# Patient Record
Sex: Female | Born: 1965 | Race: Black or African American | Hispanic: No | Marital: Single | Smoking: Current every day smoker
Health system: Southern US, Community
[De-identification: ages and names within clinical notes are randomized; demographics above are authoritative.]

## PROBLEM LIST (undated history)

## (undated) DIAGNOSIS — D649 Anemia, unspecified: Secondary | ICD-10-CM

## (undated) DIAGNOSIS — C801 Malignant (primary) neoplasm, unspecified: Secondary | ICD-10-CM

## (undated) HISTORY — DX: Anemia, unspecified: D64.9

---
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
## 2010-04-01 ENCOUNTER — Emergency Department (HOSPITAL_COMMUNITY): Admission: EM | Admit: 2010-04-01 | Discharge: 2010-04-01 | Payer: Self-pay | Admitting: Emergency Medicine

## 2010-08-22 LAB — BASIC METABOLIC PANEL
Calcium: 9.6 mg/dL (ref 8.4–10.5)
GFR calc non Af Amer: >60 mL/min (ref >60)
Glucose, Bld: 104 mg/dL — ABNORMAL HIGH (ref 70–99)
Sodium: 138 mEq/L (ref 135–145)

## 2010-08-22 LAB — DIFFERENTIAL
Basophils Absolute: 0 10*3/uL (ref 0.0–0.1)
Basophils Relative: 0 % (ref 0–1)
Neutro Abs: 5.9 10*3/uL (ref 1.7–7.7)
Neutrophils Relative %: 66 % (ref 43–77)

## 2010-08-22 LAB — CBC
Hemoglobin: 13 g/dL (ref 12.0–15.0)
MCHC: 33.3 g/dL (ref 30.0–36.0)
RDW: 15.9 % — ABNORMAL HIGH (ref 11.5–15.5)
WBC: 8.9 10*3/uL (ref 4.0–10.5)

## 2016-10-22 ENCOUNTER — Other Ambulatory Visit (HOSPITAL_COMMUNITY): Payer: Self-pay | Admitting: *Deleted

## 2016-10-22 DIAGNOSIS — R2232 Localized swelling, mass and lump, left upper limb: Secondary | ICD-10-CM

## 2016-10-22 DIAGNOSIS — N6452 Nipple discharge: Secondary | ICD-10-CM

## 2016-10-24 ENCOUNTER — Ambulatory Visit (HOSPITAL_COMMUNITY)
Admission: RE | Admit: 2016-10-24 | Discharge: 2016-10-24 | Disposition: A | Discharge status: Home / Self Care | Payer: Self-pay | Source: Ambulatory Visit | Attending: Obstetrics and Gynecology | Admitting: Obstetrics and Gynecology

## 2016-10-24 ENCOUNTER — Encounter (HOSPITAL_COMMUNITY): Payer: Self-pay | Admitting: *Deleted

## 2016-10-24 ENCOUNTER — Other Ambulatory Visit (HOSPITAL_COMMUNITY): Payer: Self-pay | Admitting: Obstetrics and Gynecology

## 2016-10-24 ENCOUNTER — Ambulatory Visit
Admission: RE | Admit: 2016-10-24 | Discharge: 2016-10-24 | Disposition: A | Discharge status: Home / Self Care | Payer: No Typology Code available for payment source | Source: Ambulatory Visit | Attending: Obstetrics and Gynecology | Admitting: Obstetrics and Gynecology

## 2016-10-24 VITALS — BP 114/72 | Temp 98.2°F | Ht 70.0 in | Wt 166.8 lb

## 2016-10-24 DIAGNOSIS — N6320 Unspecified lump in the left breast, unspecified quadrant: Secondary | ICD-10-CM

## 2016-10-24 DIAGNOSIS — Z01419 Encounter for gynecological examination (general) (routine) without abnormal findings: Secondary | ICD-10-CM

## 2016-10-24 DIAGNOSIS — N6452 Nipple discharge: Secondary | ICD-10-CM

## 2016-10-24 DIAGNOSIS — R2232 Localized swelling, mass and lump, left upper limb: Secondary | ICD-10-CM

## 2016-10-24 DIAGNOSIS — N6321 Unspecified lump in the left breast, upper outer quadrant: Secondary | ICD-10-CM

## 2016-10-24 NOTE — Progress Notes (Signed)
Complaints of left axillary lump since December 2017 that has increase in size. Patient complained bloody spontaneous discharge from left breast.  Pap Smear: Pap smear completed today. Last Pap smear was 29 years ago and normal per patient. Per patient has no history of an abnormal Pap smear. No Pap smear results are in EPIC.  Physical exam: Breasts Breasts symmetrical. No skin abnormalities bilateral breasts. No nipple retraction bilateral breasts. No nipple discharge right breast. Bloody discharge expressed from left breast. Sample of discharge sent to cytology for evaluation. No lymphadenopathy. No lumps palpated right breast. Palpated a lump lump within the left breast at 2 o'clock 5 cm from the nipple and in the left axilla at 2 o'clock 21 cm from the nipple. No complaints of pain or tenderness on exam. Referred patient to the [facility] for a diagnostic mammogram and left breast ultrasound. Appointment scheduled for Thursday, Oct 24, 2016 at 1500.  Pelvic/Bimanual   Ext Genitalia No lesions, no swelling and no discharge observed on external genitalia.         Vagina Vagina pink and normal texture. No lesions or discharge observed in vagina.          Cervix Cervix is present. Cervix pink and of normal texture. No discharge observed.     Uterus Uterus is present and palpable. Uterus is tilted to the left and normal size.        Adnexae Bilateral ovaries present and palpable. No tenderness on palpation.          Rectovaginal No rectal exam completed today since patient had no rectal complaints. No skin abnormalities observed on exam.    Smoking History: Patient is a current smoker. Discussed smoking cessation with patient. Referred to the [facility] Quitline and gave resources to free smoking cessation classes at [facility].  Patient Navigation: Patient education provided. Access to services provided for patient through [facility] program.   Colorectal Cancer Screening: Per  patient has never had a colonoscopy completed. No complaints today. FIT Test given to patient to complete and return to [facility].

## 2016-10-24 NOTE — Patient Instructions (Addendum)
Explained breast self awareness with Levy Pupa. Let patient know [facility] will cover Pap smears and HPV typing every 5 years unless has a history of abnormal Pap smears. Referred patient to the [facility] for a diagnostic mammogram and left breast ultrasound. Appointment scheduled for Thursday, Oct 24, 2016 at 1500. Let patient know will follow up with her within the next couple weeks with results of Pap smear by phone. Discussed smoking cessation with patient. Referred to the [facility] Quitline and gave resources to free smoking cessation classes at [facility]. Levy Pupa verbalized understanding.  Brannock, Arvil Chaco, RN 3:23 PM

## 2016-10-25 ENCOUNTER — Encounter (HOSPITAL_COMMUNITY): Payer: Self-pay | Admitting: *Deleted

## 2016-10-25 LAB — CYTOLOGY - PAP
DIAGNOSIS: NEGATIVE
HPV (WINDOPATH): NOT DETECTED

## 2016-10-30 ENCOUNTER — Ambulatory Visit
Admission: RE | Admit: 2016-10-30 | Discharge: 2016-10-30 | Disposition: A | Discharge status: Home / Self Care | Payer: No Typology Code available for payment source | Source: Ambulatory Visit | Attending: Obstetrics and Gynecology | Admitting: Obstetrics and Gynecology

## 2016-10-30 ENCOUNTER — Other Ambulatory Visit (HOSPITAL_COMMUNITY): Payer: Self-pay | Admitting: Obstetrics and Gynecology

## 2016-10-30 DIAGNOSIS — N6320 Unspecified lump in the left breast, unspecified quadrant: Secondary | ICD-10-CM

## 2016-10-30 DIAGNOSIS — N6452 Nipple discharge: Secondary | ICD-10-CM

## 2016-11-06 ENCOUNTER — Telehealth (HOSPITAL_COMMUNITY): Payer: Self-pay | Admitting: *Deleted

## 2016-11-06 NOTE — Telephone Encounter (Signed)
Attempted to call patient to discuss breast discharge and Pap smear results. No one answered phone. Left voicemail for patient to call me back.   Patient called me back. Asked patient if the bloody breast discharge has resolved. Patient stated it has. Let her know if it starts again to call me. Told her I would refer her for a surgical consult. Informed patient that [facility] would cover. Also, let patient know that her Pap smear is normal and HPV negative. Told her that she her next Pap smear is due in 5 years since she has no history of an abnormal Pap smear. Patient verbalized understanding.

## 2017-02-03 ENCOUNTER — Telehealth (HOSPITAL_COMMUNITY): Payer: Self-pay

## 2017-02-03 NOTE — Telephone Encounter (Signed)
Left message with patient her about the at home FIT test that was given to her in [place] on 10/24/16. I let the patient know if she had any questions she could call me back.

## 2018-03-26 ENCOUNTER — Encounter (HOSPITAL_COMMUNITY): Payer: Self-pay | Admitting: Emergency Medicine

## 2018-03-26 ENCOUNTER — Emergency Department (HOSPITAL_COMMUNITY): Payer: BC Managed Care – PPO

## 2018-03-26 ENCOUNTER — Emergency Department (HOSPITAL_COMMUNITY)
Admission: EM | Admit: 2018-03-26 | Discharge: 2018-03-26 | Disposition: A | Discharge status: Home / Self Care | Payer: BC Managed Care – PPO | Attending: Emergency Medicine | Admitting: Emergency Medicine

## 2018-03-26 DIAGNOSIS — E876 Hypokalemia: Secondary | ICD-10-CM

## 2018-03-26 DIAGNOSIS — F17210 Nicotine dependence, cigarettes, uncomplicated: Secondary | ICD-10-CM | POA: Insufficient documentation

## 2018-03-26 DIAGNOSIS — R1031 Right lower quadrant pain: Secondary | ICD-10-CM | POA: Insufficient documentation

## 2018-03-26 LAB — COMPREHENSIVE METABOLIC PANEL
ALBUMIN: 3.8 g/dL (ref 3.5–5.0)
ALT: 28 U/L (ref 0–44)
AST: 34 U/L (ref 15–41)
Alkaline Phosphatase: 68 U/L (ref 38–126)
Anion gap: 9 (ref 5–15)
BUN: 5 mg/dL — AB (ref 6–20)
CHLORIDE: 102 mmol/L (ref 98–111)
CO2: 25 mmol/L (ref 22–32)
CREATININE: 0.7 mg/dL (ref 0.44–1.00)
Calcium: 9.4 mg/dL (ref 8.9–10.3)
GFR calc Af Amer: >60 mL/min (ref >60)
GFR calc non Af Amer: >60 mL/min (ref >60)
GLUCOSE: 111 mg/dL — AB (ref 70–99)
Potassium: 3.3 mmol/L — ABNORMAL LOW (ref 3.5–5.1)
SODIUM: 136 mmol/L (ref 135–145)
Total Bilirubin: 0.6 mg/dL (ref 0.3–1.2)
Total Protein: 7.6 g/dL (ref 6.5–8.1)

## 2018-03-26 LAB — URINALYSIS, ROUTINE W REFLEX MICROSCOPIC
Bilirubin Urine: NEGATIVE
Glucose, UA: 50 mg/dL — AB
KETONES UR: 20 mg/dL — AB
Leukocytes, UA: NEGATIVE
Nitrite: POSITIVE — AB
PROTEIN: NEGATIVE mg/dL
Specific Gravity, Urine: 1.02 (ref 1.005–1.030)
pH: 6 (ref 5.0–8.0)

## 2018-03-26 LAB — CBC
HEMATOCRIT: 34.8 % — AB (ref 36.0–46.0)
Hemoglobin: 10.8 g/dL — ABNORMAL LOW (ref 12.0–15.0)
MCH: 23.7 pg — ABNORMAL LOW (ref 26.0–34.0)
MCHC: 31 g/dL (ref 30.0–36.0)
MCV: 76.5 fL — AB (ref 80.0–100.0)
Platelets: 573 10*3/uL — ABNORMAL HIGH (ref 150–400)
RBC: 4.55 MIL/uL (ref 3.87–5.11)
RDW: 19.2 % — AB (ref 11.5–15.5)
WBC: 7.5 10*3/uL (ref 4.0–10.5)
nRBC: 0 % (ref 0.0–0.2)

## 2018-03-26 LAB — LIPASE, BLOOD: LIPASE: 30 U/L (ref 11–51)

## 2018-03-26 MED ORDER — METRONIDAZOLE 500 MG PO TABS
500.0000 mg | ORAL_TABLET | Freq: Once | ORAL | Status: AC
  Administered 2018-03-26: 500 mg via ORAL
  Filled 2018-03-26: qty 1

## 2018-03-26 MED ORDER — ONDANSETRON HCL 4 MG/2ML IJ SOLN
4.0000 mg | Freq: Once | INTRAMUSCULAR | Status: AC
  Administered 2018-03-26: 4 mg via INTRAVENOUS
  Filled 2018-03-26: qty 2

## 2018-03-26 MED ORDER — POTASSIUM CHLORIDE CRYS ER 20 MEQ PO TBCR: 20.0000 meq | EXTENDED_RELEASE_TABLET | Freq: Two times a day (BID) | ORAL | 0 refills | Status: DC

## 2018-03-26 MED ORDER — CIPROFLOXACIN HCL 500 MG PO TABS: 500.0000 mg | ORAL_TABLET | Freq: Two times a day (BID) | ORAL | 0 refills | Status: DC

## 2018-03-26 MED ORDER — IOHEXOL 300 MG/ML  SOLN
100.0000 mL | Freq: Once | INTRAMUSCULAR | Status: AC | PRN
  Administered 2018-03-26: 100 mL via INTRAVENOUS

## 2018-03-26 MED ORDER — CIPROFLOXACIN HCL 500 MG PO TABS
500.0000 mg | ORAL_TABLET | Freq: Once | ORAL | Status: AC
  Administered 2018-03-26: 500 mg via ORAL
  Filled 2018-03-26: qty 1

## 2018-03-26 MED ORDER — SODIUM CHLORIDE 0.9 % IV BOLUS
1000.0000 mL | Freq: Once | INTRAVENOUS | Status: AC
  Administered 2018-03-26: 1000 mL via INTRAVENOUS

## 2018-03-26 MED ORDER — METRONIDAZOLE 500 MG PO TABS: 500.0000 mg | ORAL_TABLET | Freq: Three times a day (TID) | ORAL | 0 refills | Status: DC

## 2018-03-26 MED ORDER — MORPHINE SULFATE (PF) 4 MG/ML IV SOLN
4.0000 mg | Freq: Once | INTRAVENOUS | Status: AC
  Administered 2018-03-26: 4 mg via INTRAVENOUS
  Filled 2018-03-26: qty 1

## 2018-03-26 MED ORDER — ONDANSETRON 4 MG PO TBDP: 4.0000 mg | ORAL_TABLET | Freq: Three times a day (TID) | ORAL | 0 refills | Status: DC | PRN

## 2018-03-26 NOTE — Discharge Instructions (Addendum)
Thank you for allowing me to care for you today in the Emergency Department.   You have been seen in the Emergency Department (ED) for abdominal pain.  You had a CT scan that showed possible infection in the colon but also is concerning for a mass or tumor.  You have been scheduled for an appointment with [facility] gastroenterology (stomach doctor) tomorrow to be checked for this.  Your potassium was slightly low today, likely from your recent vomiting.  Take 1 tablet of potassium chloride 2 times daily for the next 5 days.  If you continue to have nausea or vomiting, let 1 tablet of Zofran dissolve in your tongue every 8 hours as needed.  For pain control, you can take 600 mg of ibuprofen with food or 650 mg of Tylenol every 6 hours.  Continue to hydrate with water or electrolyte drinks until your symptoms resolved.  Please follow up as instructed above regarding todays emergent visit and the symptoms that are bothering you.  Return to the ED if your abdominal pain worsens or fails to improve, you develop bloody vomiting, bloody diarrhea, you are unable to tolerate fluids due to vomiting, fever greater than 101, or other symptoms that concern you.

## 2018-03-26 NOTE — Discharge Planning (Signed)
Cyrene Gharibian J. Clydene Laming, RN, BSN, [place]  [facility] set up appointment with Domenica Fail, PA-C at [facility] on 11/11 @ 2:30.  Spoke with pt at bedside and advised to please arrive 15 min early and take a picture ID and your current medications.  Pt verbalizes understanding of keeping appointment.

## 2018-03-26 NOTE — ED Provider Notes (Addendum)
8:32 AM Handoff from [place] PA-C at shift change. Pt with 3 days of diarrhea and vomiting. She has RLQ pain, pending CT. Also reports recent weight loss. No prior colonoscopy.   CT with features concerning of possible mass in the cecal area.  Also possible colitis.  Patient will be treated for colitis with antibiotics given her vomiting and diarrhea.  I will speak with GI to help set up colonoscopy.  Case manager to see regarding primary care follow-up.  Patient updated and is in agreement with plan.  9:26 AM GI appointment tomorrow at 9:30am.   PCP appointment scheduled for 04/20/2018.  Patient has received first dose of antibiotics.  She is aware of plan and in agreement.  Home with symptom medic treatment as well.  The patient was urged to return to the Emergency Department immediately with worsening of current symptoms, worsening abdominal pain, persistent vomiting, blood noted in stools, fever, or any other concerns. The patient verbalized understanding.     Carlisle Cater, PA-C 03/26/18 2841    [place] Cater, PA-C 03/26/18 3244    [facility] Rasmussen, MD 03/27/18 1225

## 2018-03-26 NOTE — ED Triage Notes (Signed)
Patient reports mid abdominal pain with emesis and diarrhea onset 3 days ago , denies fever or chills .

## 2018-03-26 NOTE — ED Provider Notes (Signed)
[facility] EMERGENCY DEPARTMENT Provider Note   CSN: [id number] Arrival date & time: 03/26/18  0326     History   Chief Complaint Chief Complaint  Patient presents with  . Abdominal Pain    HPI Tara Baxter is a 52 y.o. female no pertinent past medical history who presents to the emergency department with a chief complaint of nominal pain, nausea, vomiting, and diarrhea, onset 3 days ago.  She endorses sudden onset, constant, non-radiating RLQ with multiple episodes of non-bloody emesis and diarrhea.  Reports decreased appetite since onset of symptoms.  She denies a fever, chills, dysuria, urinary frequency or hesitancy, melena, hematochezia, vaginal bleeding or discharge, back pain, lightheadedness, chest pain, or dyspnea.  No history of similar.  She has been treating her symptoms by drinking Gatorade.  No known sick contacts.  No concern for questionable food intake. No recent travel.  No history of abdominal surgery.  The history is provided by the patient. No language interpreter was used.    History reviewed. No pertinent past medical history.  There are no active problems to display for this patient.   History reviewed. No pertinent surgical history.   OB History    Gravida  2   Para      Term      Preterm      AB      Living  2     SAB      TAB      Ectopic      Multiple      Live Births  2            Home Medications    Prior to Admission medications   Medication Sig Start Date End Date Taking? Authorizing Provider  ondansetron (ZOFRAN ODT) 4 MG disintegrating tablet Take 1 tablet (4 mg total) by mouth every 8 (eight) hours as needed for nausea or vomiting. 03/26/18   McDonald, Mia A, PA-C  potassium chloride SA (K-DUR,KLOR-CON) 20 MEQ tablet Take 1 tablet (20 mEq total) by mouth 2 (two) times daily for 5 days. 03/26/18 03/31/18  McDonald, Laymond Purser, PA-C    Family History Family History  Problem Relation Age of Onset    . Diabetes Mother   . Kidney disease Mother   . Hypertension Mother   . Hypertension Sister   . Hypertension Brother   . Breast cancer Maternal Aunt     Social History Social History   Tobacco Use  . Smoking status: Current Every Day Smoker    Packs/day: 0.25    Years: 25.00    Pack years: 6.25    Types: Cigarettes  . Smokeless tobacco: Never Used  Substance Use Topics  . Alcohol use: No  . Drug use: No     Allergies   Patient has no known allergies.   Review of Systems Review of Systems  Constitutional: Negative for activity change, chills and fever.  HENT: Negative for congestion.   Respiratory: Negative for cough and shortness of breath.   Cardiovascular: Negative for chest pain.  Gastrointestinal: Positive for abdominal pain, diarrhea, nausea and vomiting. Negative for anal bleeding, blood in stool, constipation and rectal pain.  Genitourinary: Negative for dysuria, frequency, urgency, vaginal bleeding and vaginal discharge.  Musculoskeletal: Negative for back pain.  Skin: Negative for rash.  Allergic/Immunologic: Negative for immunocompromised state.  Neurological: Negative for headaches.  Psychiatric/Behavioral: Negative for confusion.     Physical Exam Updated Vital Signs BP 140/77   Pulse Marland Kitchen)  52   Temp 98.8 F (37.1 C) (Oral)   Resp 16   SpO2 98%   Physical Exam  Constitutional: No distress.  HENT:  Head: Normocephalic.  Eyes: Conjunctivae are normal.  Neck: Neck supple.  Cardiovascular: Normal rate, regular rhythm, normal heart sounds and intact distal pulses. Exam reveals no gallop and no friction rub.  No murmur heard. Pulmonary/Chest: Effort normal and breath sounds normal. No stridor. No respiratory distress. She has no wheezes. She has no rales. She exhibits no tenderness.  Abdominal: Soft. Bowel sounds are normal. She exhibits no distension and no mass. There is tenderness. There is no rebound and no guarding. No hernia.  Focal TTP in  the RLQ over McBurney's point. No rebound or guarding. Negative Rovsing's. Negative CVA tenderness bilaterally. Negative Murphy's sign.    Musculoskeletal: She exhibits no tenderness.  Neurological: She is alert.  Skin: Skin is warm. Capillary refill takes 2 to 3 seconds. No rash noted. She is not diaphoretic.  Psychiatric: Her behavior is normal.  Nursing note and vitals reviewed.  ED Treatments / Results  Labs (all labs ordered are listed, but only abnormal results are displayed) Labs Reviewed  COMPREHENSIVE METABOLIC PANEL - Abnormal; Notable for the following components:      Result Value   Potassium 3.3 (*)    Glucose, Bld 111 (*)    BUN 5 (*)    All other components within normal limits  CBC - Abnormal; Notable for the following components:   Hemoglobin 10.8 (*)    HCT 34.8 (*)    MCV 76.5 (*)    MCH 23.7 (*)    RDW 19.2 (*)    Platelets 573 (*)    All other components within normal limits  LIPASE, BLOOD  URINALYSIS, ROUTINE W REFLEX MICROSCOPIC    EKG None  Radiology No results found.  Procedures Procedures (including critical care time)  Medications Ordered in ED Medications  ondansetron (ZOFRAN) injection 4 mg (4 mg Intravenous Given 03/26/18 0435)  morphine 4 MG/ML injection 4 mg (4 mg Intravenous Given 03/26/18 0436)  sodium chloride 0.9 % bolus 1,000 mL (0 mLs Intravenous Stopped 03/26/18 0536)     Initial Impression / Assessment and Plan / ED Course  I have reviewed the triage vital signs and the nursing notes.  Pertinent labs & imaging results that were available during my care of the patient were reviewed by me and considered in my medical decision making (see chart for details).     52 year old female with no pertinent past medical history presenting with nausea, vomiting, diarrhea, and abdominal pain x3 days.  On exam, she has point tenderness in her right lower quadrant over McBurney's point.  The remainder of her abdominal exam is unremarkable  and without tenderness.  Afebrile without tachycardia and normotensive in the ED.  Zofran given for nausea and vomiting.  She was given IV fluids and morphine for pain control.  On reevaluation, no additional episodes of nausea or vomiting, and she reports moderate improvement in her pain.  On reexamination, she continues to have focal tenderness in her right lower quadrant.  Doubt cholecystitis, diverticulitis, pyelonephritis, ovarian torsion, or mesenteric ischemia.  Labs are notable for hypokalemia of 3.3 and hemoglobin of 10.8, which is approximately 3 mg/dl decreased since her last blood work in 2011.  Suspect this may be chronic as the patient has no signs or symptoms of active bleeding at this time.  Given continued pain, will order CT abdomen pelvis to assess  for atypical presenting appendicitis.  If CT is unremarkable for acute pathology, will plan to discharge the patient home with Zofran and potassium chloride as well as follow-up with primary care for her hemoglobin level if she is successfully fluid challenged.    Patient care transferred to PA Geiple at the end of my shift. Patient presentation, ED course, and plan of care discussed with review of all pertinent labs and imaging. Please see his/her note for further details regarding further ED course and disposition.  Final Clinical Impressions(s) / ED Diagnoses   Final diagnoses:  None    ED Discharge Orders         Ordered    potassium chloride SA (K-DUR,KLOR-CON) 20 MEQ tablet  2 times daily     03/26/18 0651    ondansetron (ZOFRAN ODT) 4 MG disintegrating tablet  Every 8 hours PRN     03/26/18 0651           Joanne Gavel, PA-C 03/26/18 5573    Palumbo, April, MD 03/26/18 2202

## 2018-03-27 ENCOUNTER — Encounter: Payer: Self-pay | Admitting: Gastroenterology

## 2018-03-27 ENCOUNTER — Ambulatory Visit (INDEPENDENT_AMBULATORY_CARE_PROVIDER_SITE_OTHER): Payer: BC Managed Care – PPO | Admitting: Gastroenterology

## 2018-03-27 VITALS — BP 146/90 | HR 74 | Ht 68.5 in | Wt 155.0 lb

## 2018-03-27 DIAGNOSIS — R933 Abnormal findings on diagnostic imaging of other parts of digestive tract: Secondary | ICD-10-CM | POA: Diagnosis not present

## 2018-03-27 MED ORDER — NA SULFATE-K SULFATE-MG SULF 17.5-3.13-1.6 GM/177ML PO SOLN: 1.0000 | Freq: Once | ORAL | 0 refills | Status: AC

## 2018-03-27 NOTE — Progress Notes (Signed)
Reviewed and agree with initial management plan.  Avyonna Wagoner T. Emannuel Vise, MD FACG 

## 2018-03-27 NOTE — Patient Instructions (Signed)
  You have been scheduled for a colonoscopy. Please follow written instructions given to you at your visit today.  Please pick up your prep supplies at the pharmacy within the next 1-3 days. If you use inhalers (even only as needed), please bring them with you on the day of your procedure. Your physician has requested that you go to www.startemmi.com and enter the access code given to you at your visit today. This web site gives a general overview about your procedure. However, you should still follow specific instructions given to you by our office regarding your preparation for the procedure.   Finish your antibiotics please.    I appreciate the opportunity to care for you. Alonza Bogus, PA-C

## 2018-03-27 NOTE — Progress Notes (Signed)
03/27/2018 Tara Baxter [id number] February 24, 1966   HISTORY OF PRESENT ILLNESS:  This is a pleasant 52 year old female who is new to our office.  Is here for ED follow-up.  Was in the ED yesterday.  She tells me that last Monday she had some abdominal pain, but then the rest of the week she felt fine.  Then this Monday she again developed abdominal pain at 3 AM and started having nausea, vomiting, diarrhea.  This continued and eventually she went to the emergency department yesterday, 10/17.  She was given IV fluids, antiemetics, pain medication.  CBC actually showed and low hemoglobin at 10.8 g.  MCV low at 76.5.  We do not have anything for comparison.  CMP and lipase were okay.  CT scan abdomen pelvis with contrast showed the following:  IMPRESSION: 1. Short segment of marked circumferential wall thickening in the ascending colon just distal to the ileocecal valve. Given the relatively short segment involvement and abrupt transition from normal to abnormal bowel wall, neoplasm is a distinct concern. Focal infectious/inflammatory colitis is possible but considered less likely. There is some adjacent edema. 2. No findings to suggest metastatic disease in the abdomen or pelvis. 3. Cholelithiasis. 4. 12 mm endometrial thickness on sagittal imaging. This would be abnormal in a postmenopausal female although could be within normal limits in a premenopausal/perimenopausal age group. Correlation for abnormal vaginal bleeding recommended.  By the time that she left the emergency department she was feeling tremendously better.  She was discharged on a course of Cipro and Flagyl and has been taking those.  She continues without symptoms today.  She says that prior to this she had been feeling fine.  Moves her bowels regularly without issues.  Denies seeing any blood in her stools.  Says that she ate a normal dinner last night without any symptoms.   History reviewed. No pertinent past medical  history. History reviewed. No pertinent surgical history.  reports that she has been smoking cigarettes. She has a 6.25 pack-year smoking history. She has never used smokeless tobacco. She reports that she does not drink alcohol or use drugs. family history includes Breast cancer in her maternal aunt; Diabetes in her mother; Hypertension in her brother, mother, and sister; Kidney disease in her mother. No Known Allergies    Outpatient Encounter Medications as of 03/27/2018  Medication Sig  . ciprofloxacin (CIPRO) 500 MG tablet Take 1 tablet (500 mg total) by mouth 2 (two) times daily.  . metroNIDAZOLE (FLAGYL) 500 MG tablet Take 1 tablet (500 mg total) by mouth 3 (three) times daily.  . ondansetron (ZOFRAN ODT) 4 MG disintegrating tablet Take 1 tablet (4 mg total) by mouth every 8 (eight) hours as needed for nausea or vomiting.  . potassium chloride SA (K-DUR,KLOR-CON) 20 MEQ tablet Take 1 tablet (20 mEq total) by mouth 2 (two) times daily for 5 days.   No facility-administered encounter medications on file as of 03/27/2018.      REVIEW OF SYSTEMS  : All other systems reviewed and negative except where noted in the History of Present Illness.   PHYSICAL EXAM: BP (!) 146/90   Pulse 74   Ht 5' 8.5" (1.74 m)   Wt 155 lb (70.3 kg)   BMI 23.22 kg/m  General: Well developed black female in no acute distress Head: Normocephalic and atraumatic Eyes:  Sclerae anicteric, conjunctiva pink. Ears: Normal auditory acuity Lungs: Clear throughout to auscultation; no W/R/R. Heart: Regular rate and rhythm;  no M/R/G. Abdomen: Soft, non-distended.  BS present.  Non-tender. Rectal:  Will be done at the time of colonoscopy. Musculoskeletal: Symmetrical with no gross deformities  Skin: No lesions on visible extremities Extremities: No edema  Neurological: Alert oriented x 4, grossly non-focal Psychological:  Alert and cooperative. Normal mood and affect  ASSESSMENT AND PLAN: *Abnormal CT scan  showing marked circumferential thickening in the ascending colon just distal to the IC valve.  She had developed sudden onset of abdominal pain, nausea, vomiting, diarrhea.  Differential included infectious or inflammatory colitis, but could not exclude neoplasm.  Symptoms were tremendously improved by the time she left the emergency department yesterday and she still continues to be asymptomatic.  Was placed on Cipro and Flagyl and she will complete that course.  Will be scheduled for colonoscopy with Dr. Fuller Plan.  **The risks, benefits, and alternatives to colonoscopy were discussed with the patient and she consents to proceed.   CC:  Constant, Vickii Chafe, MD

## 2018-04-06 ENCOUNTER — Encounter: Payer: Self-pay | Admitting: Gastroenterology

## 2018-04-13 ENCOUNTER — Telehealth: Payer: Self-pay | Admitting: Gastroenterology

## 2018-04-13 NOTE — Telephone Encounter (Signed)
noted 

## 2018-04-14 ENCOUNTER — Other Ambulatory Visit: Payer: Self-pay

## 2018-04-14 ENCOUNTER — Encounter (INDEPENDENT_AMBULATORY_CARE_PROVIDER_SITE_OTHER): Payer: Self-pay | Admitting: Physician Assistant

## 2018-04-14 ENCOUNTER — Ambulatory Visit (INDEPENDENT_AMBULATORY_CARE_PROVIDER_SITE_OTHER): Payer: BC Managed Care – PPO | Admitting: Physician Assistant

## 2018-04-14 VITALS — BP 153/89 | HR 91 | Temp 98.3°F | Ht 68.5 in | Wt 148.0 lb

## 2018-04-14 DIAGNOSIS — Z131 Encounter for screening for diabetes mellitus: Secondary | ICD-10-CM | POA: Diagnosis not present

## 2018-04-14 DIAGNOSIS — Z09 Encounter for follow-up examination after completed treatment for conditions other than malignant neoplasm: Secondary | ICD-10-CM

## 2018-04-14 DIAGNOSIS — R739 Hyperglycemia, unspecified: Secondary | ICD-10-CM

## 2018-04-14 DIAGNOSIS — R634 Abnormal weight loss: Secondary | ICD-10-CM

## 2018-04-14 DIAGNOSIS — R030 Elevated blood-pressure reading, without diagnosis of hypertension: Secondary | ICD-10-CM

## 2018-04-14 DIAGNOSIS — R112 Nausea with vomiting, unspecified: Secondary | ICD-10-CM

## 2018-04-14 DIAGNOSIS — E876 Hypokalemia: Secondary | ICD-10-CM

## 2018-04-14 DIAGNOSIS — D649 Anemia, unspecified: Secondary | ICD-10-CM | POA: Diagnosis not present

## 2018-04-14 DIAGNOSIS — N390 Urinary tract infection, site not specified: Secondary | ICD-10-CM

## 2018-04-14 DIAGNOSIS — K6389 Other specified diseases of intestine: Secondary | ICD-10-CM

## 2018-04-14 DIAGNOSIS — R252 Cramp and spasm: Secondary | ICD-10-CM

## 2018-04-14 LAB — POCT URINALYSIS DIPSTICK
Glucose, UA: NEGATIVE
LEUKOCYTES UA: NEGATIVE
Nitrite, UA: NEGATIVE
Protein, UA: POSITIVE — AB
Spec Grav, UA: 1.02 (ref 1.010–1.025)
Urobilinogen, UA: 1 E.U./dL
pH, UA: 6 (ref 5.0–8.0)

## 2018-04-14 LAB — POCT GLYCOSYLATED HEMOGLOBIN (HGB A1C): HEMOGLOBIN A1C: 5.1 % (ref 4.0–5.6)

## 2018-04-14 MED ORDER — ONDANSETRON 8 MG PO TBDP: 8.0000 mg | ORAL_TABLET | Freq: Three times a day (TID) | ORAL | 0 refills | Status: DC | PRN

## 2018-04-14 NOTE — Progress Notes (Signed)
Subjective:  Patient ID: Tara Baxter, female    DOB: [birth year]  Age: 52 y.o. MRN: [id number]  CC: hospital f/u  HPI Tara Baxter is a 53 y.o. female with a medical history of left breast lump presents as a new patient on hospital f/u. Went to ED on 03/26/18 with complaint of diarrhea and vomiting x3 days. CT with features concerning of possible mass in the cecal area.  Also possible colitis. Lab workup revealed mild hypokalemia, mild anemia, elevated platelets, positive urinary nitrites, small urinary glucose, small urinary ketones. Blood glucose 111 mg/dL. Pt diagnosed with hypokalemia but she was thought to have colitis. Pt prescribed cipro, metronidazole, ondansetron, and potassium chloride.  Patient states she took antibiotics to completion and was feeling well while taking antibiotics but nausea returned once antibiotics and zofran finished. Pt complains of weight loss of 17 lbs over approximately 4 months. She also has RLE cramping. Has been taking pedialyte but is ineffective. She is scheduled to see [facility] in two days for a colonoscopy. Does not endorse any other symptoms or complaints.         Outpatient Medications Prior to Visit  Medication Sig Dispense Refill  . potassium chloride SA (K-DUR,KLOR-CON) 20 MEQ tablet Take 1 tablet (20 mEq total) by mouth 2 (two) times daily for 5 days. 10 tablet 0  . ciprofloxacin (CIPRO) 500 MG tablet Take 1 tablet (500 mg total) by mouth 2 (two) times daily. 14 tablet 0  . metroNIDAZOLE (FLAGYL) 500 MG tablet Take 1 tablet (500 mg total) by mouth 3 (three) times daily. 21 tablet 0  . ondansetron (ZOFRAN ODT) 4 MG disintegrating tablet Take 1 tablet (4 mg total) by mouth every 8 (eight) hours as needed for nausea or vomiting. 20 tablet 0   No facility-administered medications prior to visit.      ROS Review of Systems  Constitutional: Positive for weight loss. Negative for chills, fever and malaise/fatigue.  Eyes: Negative for blurred  vision.  Respiratory: Negative for shortness of breath.   Cardiovascular: Negative for chest pain and palpitations.  Gastrointestinal: Positive for nausea and vomiting. Negative for abdominal pain.  Genitourinary: Negative for dysuria and hematuria.  Musculoskeletal: Negative for joint pain and myalgias.  Skin: Negative for rash.  Neurological: Negative for tingling and headaches.  Psychiatric/Behavioral: Negative for depression. The patient is not nervous/anxious.     Objective:  BP (!) 170/103 (BP Location: Left Arm, Patient Position: Sitting, Cuff Size: Normal)   Pulse (!) 102   Temp 98.3 F (36.8 C) (Oral)   Ht 5' 8.5" (1.74 m)   Wt 148 lb (67.1 kg)   SpO2 98%   BMI 22.18 kg/m   BP/Weight 04/14/2018 03/27/2018 13/24/4010  Systolic BP 272 536 644  Diastolic BP 034 90 82  Wt. (Lbs) 148 155 -  BMI 22.18 23.22 -      Physical Exam  Constitutional: She is oriented to person, place, and time.  Well developed, thin, NAD, polite  HENT:  Head: Normocephalic and atraumatic.  Eyes: No scleral icterus.  Neck: Normal range of motion. Neck supple. No thyromegaly present.  Cardiovascular: Normal rate, regular rhythm and normal heart sounds.  No LE edema bilaterally.  Pulmonary/Chest: Effort normal and breath sounds normal.  Musculoskeletal: She exhibits no edema.  Neurological: She is alert and oriented to person, place, and time.  Skin: Skin is warm and dry. No rash noted. No erythema. No pallor.  Psychiatric: She has a normal mood and affect.  Her behavior is normal. Thought content normal.  Vitals reviewed.    Assessment & Plan:    1. Nausea and vomiting, intractability of vomiting not specified, unspecified vomiting type - Increase ondansetron (ZOFRAN-ODT) 8 MG disintegrating tablet; Take 1 tablet (8 mg total) by mouth every 8 (eight) hours as needed for nausea or vomiting.  Dispense: 42 tablet; Refill: 0 - Etiology unknown at this time but colonic mass may be  contributing. Set to see GI in two days.   2. Hyperglycemia - HgB A1c 5.1% today.  3. Hypokalemia - Basic Metabolic Panel - TSH  4. Anemia, unspecified type - CBC with Differential - TSH  5. Colonic mass - Appointment with GI in two days.   6. Loss of weight - Drug Screen, Urine - Will need to keep appointment for colonoscopy to assess colonic mass and possibility of malignancy.  7. Leg cramping - BMP  8. Urinary tract infection without hematuria, site unspecified - Urinalysis Dipstick with trace leukocytes - Urine Culture  9. Screening for diabetes mellitus - HgB A1c 5.1%  10. Hospital discharge follow-up - Notes reviewed  51. Elevated blood-pressure reading without diagnosis of hypertension - Recheck at f/u appt.  Meds ordered this encounter  Medications  . ondansetron (ZOFRAN-ODT) 8 MG disintegrating tablet    Sig: Take 1 tablet (8 mg total) by mouth every 8 (eight) hours as needed for nausea or vomiting.    Dispense:  42 tablet    Refill:  0    Order Specific Question:   Supervising Provider    Answer:   Charlott Rakes [[id number]]    Follow-up: Return in about 4 weeks (around 05/12/2018) for Nausea, weight loss, BP.   Clent Demark PA

## 2018-04-14 NOTE — Patient Instructions (Signed)
Nausea, Adult Feeling sick to your stomach (nausea) means that your stomach is upset or you feel like you have to throw up (vomit). Feeling sick to your stomach is usually not serious, but it may be an early sign of a more serious medical problem. As you feel sicker to your stomach, it can lead to throwing up (vomiting). If you throw up, or if you are not able to drink enough fluids, there is a risk of dehydration. Dehydration can make you feel tired and thirsty, have a dry mouth, and pee (urinate) less often. Older adults and people who have other diseases or a weak defense (immune) system have a higher risk of dehydration. The main goal of treating this condition is to:  Limit how often you feel sick to your stomach.  Prevent throwing up and dehydration.  Follow these instructions at home: Follow instructions from your doctor about how to care for yourself at home. Eating and drinking Follow these recommendations as told by your doctor:  Take an oral rehydration solution (ORS). This is a drink that is sold at pharmacies and stores.  Drink clear fluids in small amounts as you are able, such as: ? Water. ? Ice chips. ? Fruit juice that has water added (diluted fruit juice). ? Low-calorie sports drinks.  Eat bland, easy to digest foods in small amounts as you are able, such as: ? Bananas. ? Applesauce. ? Rice. ? Lean meats. ? Toast. ? Crackers.  Avoid drinking fluids that contain a lot of sugar or caffeine.  Avoid alcohol.  Avoid spicy or fatty foods.  General instructions  Drink enough fluid to keep your pee (urine) clear or pale yellow.  Wash your hands often. If you cannot use soap and water, use hand sanitizer.  Make sure that all people in your household wash their hands well and often.  Rest at home while you get better.  Take over-the-counter and prescription medicines only as told by your doctor.  Breathe slowly and deeply when you feel sick to your  stomach.  Watch your condition for any changes.  Keep all follow-up visits as told by your doctor. This is important. Contact a doctor if:  You have a headache.  You have new symptoms.  You feel sicker to your stomach.  You have a fever.  You feel light-headed or dizzy.  You throw up.  You are not able to keep fluids down. Get help right away if:  You have pain in your chest, neck, arm, or jaw.  You feel very weak or you pass out (faint).  You have throw up that is bright red or looks like coffee grounds.  You have bloody or black poop (stools), or poop that looks like tar.  You have a very bad headache, a stiff neck, or both.  You have very bad pain, cramping, or bloating in your belly.  You have a rash.  You have trouble breathing or you are breathing very quickly.  Your heart is beating very quickly.  Your skin feels cold and clammy.  You feel confused.  You have pain while peeing.  You have signs of dehydration, such as: ? Dark pee, or very little or no pee. ? Cracked lips. ? Dry mouth. ? Sunken eyes. ? Sleepiness. ? Weakness. These symptoms may be an emergency. Do not wait to see if the symptoms will go away. Get medical help right away. Call your local emergency services (911 in the [place] Do not drive yourself to   the hospital. This information is not intended to replace advice given to you by your health care provider. Make sure you discuss any questions you have with your health care provider. Document Released: 05/16/2011 Document Revised: 11/02/2015 Document Reviewed: 01/31/2015 Elsevier Interactive Patient Education  2018 [employer]  

## 2018-04-15 ENCOUNTER — Telehealth (INDEPENDENT_AMBULATORY_CARE_PROVIDER_SITE_OTHER): Payer: Self-pay

## 2018-04-15 LAB — CBC WITH DIFFERENTIAL/PLATELET
BASOS ABS: 0.1 10*3/uL (ref 0.0–0.2)
BASOS: 1 %
EOS (ABSOLUTE): 0 10*3/uL (ref 0.0–0.4)
Eos: 0 %
Hematocrit: 34.6 % (ref 34.0–46.6)
Hemoglobin: 11 g/dL — ABNORMAL LOW (ref 11.1–15.9)
IMMATURE GRANULOCYTES: 0 %
Immature Grans (Abs): 0 10*3/uL (ref 0.0–0.1)
Lymphocytes Absolute: 1.7 10*3/uL (ref 0.7–3.1)
Lymphs: 28 %
MCH: 24.7 pg — ABNORMAL LOW (ref 26.6–33.0)
MCHC: 31.8 g/dL (ref 31.5–35.7)
MCV: 78 fL — AB (ref 79–97)
MONOS ABS: 0.5 10*3/uL (ref 0.1–0.9)
Monocytes: 8 %
NEUTROS PCT: 63 %
Neutrophils Absolute: 3.9 10*3/uL (ref 1.4–7.0)
PLATELETS: 595 10*3/uL — AB (ref 150–450)
RBC: 4.45 x10E6/uL (ref 3.77–5.28)
RDW: 17.9 % — AB (ref 12.3–15.4)
WBC: 6.1 10*3/uL (ref 3.4–10.8)

## 2018-04-15 LAB — DRUG SCREEN, URINE
Amphetamines, Urine: NEGATIVE ng/mL
BARBITURATE SCREEN URINE: NEGATIVE ng/mL
Benzodiazepine Quant, Ur: NEGATIVE ng/mL
Cannabinoid Quant, Ur: POSITIVE ng/mL — AB
Cocaine (Metab.): NEGATIVE ng/mL
OPIATE QUANT UR: NEGATIVE ng/mL
PCP QUANT UR: NEGATIVE ng/mL

## 2018-04-15 LAB — BASIC METABOLIC PANEL
BUN/Creatinine Ratio: 10 (ref 9–23)
BUN: 8 mg/dL (ref 6–24)
CALCIUM: 10.1 mg/dL (ref 8.7–10.2)
CHLORIDE: 98 mmol/L (ref 96–106)
CO2: 21 mmol/L (ref 20–29)
Creatinine, Ser: 0.82 mg/dL (ref 0.57–1.00)
GFR calc non Af Amer: 82 mL/min/{1.73_m2} (ref >59)
GFR, EST AFRICAN AMERICAN: 94 mL/min/{1.73_m2} (ref >59)
GLUCOSE: 113 mg/dL — AB (ref 65–99)
POTASSIUM: 4.1 mmol/L (ref 3.5–5.2)
Sodium: 136 mmol/L (ref 134–144)

## 2018-04-15 LAB — TSH: TSH: 0.539 u[IU]/mL (ref 0.450–4.500)

## 2018-04-15 NOTE — Telephone Encounter (Signed)
Patient is aware that anemia is nearly resolved but platelets are still elevated. Redraw in 2-4 weeks. Potassium and thyroid normal. Nat Christen, CMA

## 2018-04-15 NOTE — Telephone Encounter (Signed)
-----   Message from Clent Demark, PA-C sent at 04/15/2018 12:29 PM EST ----- Anemia is nearly resolved. Platelets still elevated. Will need to redraw in 2-4 weeks. Potassium back to normal. Thyroid is normal.

## 2018-04-16 ENCOUNTER — Telehealth: Payer: Self-pay

## 2018-04-16 ENCOUNTER — Encounter: Payer: Self-pay | Admitting: Gastroenterology

## 2018-04-16 ENCOUNTER — Ambulatory Visit (AMBULATORY_SURGERY_CENTER): Payer: BC Managed Care – PPO | Admitting: Gastroenterology

## 2018-04-16 VITALS — BP 160/86 | HR 68 | Temp 99.8°F | Resp 16 | Ht 68.0 in | Wt 155.0 lb

## 2018-04-16 DIAGNOSIS — K635 Polyp of colon: Secondary | ICD-10-CM | POA: Diagnosis not present

## 2018-04-16 DIAGNOSIS — D122 Benign neoplasm of ascending colon: Secondary | ICD-10-CM | POA: Diagnosis not present

## 2018-04-16 DIAGNOSIS — R634 Abnormal weight loss: Secondary | ICD-10-CM

## 2018-04-16 DIAGNOSIS — C182 Malignant neoplasm of ascending colon: Secondary | ICD-10-CM

## 2018-04-16 DIAGNOSIS — D123 Benign neoplasm of transverse colon: Secondary | ICD-10-CM

## 2018-04-16 DIAGNOSIS — D125 Benign neoplasm of sigmoid colon: Secondary | ICD-10-CM

## 2018-04-16 DIAGNOSIS — K6389 Other specified diseases of intestine: Secondary | ICD-10-CM

## 2018-04-16 DIAGNOSIS — R933 Abnormal findings on diagnostic imaging of other parts of digestive tract: Secondary | ICD-10-CM

## 2018-04-16 MED ORDER — SODIUM CHLORIDE 0.9 % IV SOLN: 500.0000 mL | Freq: Once | INTRAVENOUS | Status: DC

## 2018-04-16 NOTE — Telephone Encounter (Signed)
-----   Message from Ladene Artist, MD sent at 04/16/2018  4:02 PM EST ----- See colonoscopy report. Needs surgery appt very soon for obstructing colon mass. Has partial obstructive symptoms with recurrent N/V. Now trying to limit diet to full liquids.

## 2018-04-16 NOTE — Patient Instructions (Signed)
Begin Full liquid diet. Continue present medications. Please read handout on polyps. Await pathology results.      YOU HAD AN ENDOSCOPIC PROCEDURE TODAY AT [facility]:   Refer to the procedure report that was given to you for any specific questions about what was found during the examination.  If the procedure report does not answer your questions, please call your gastroenterologist to clarify.  If you requested that your care partner not be given the details of your procedure findings, then the procedure report has been included in a sealed envelope for you to review at your convenience later.  YOU SHOULD EXPECT: Some feelings of bloating in the abdomen. Passage of more gas than usual.  Walking can help get rid of the air that was put into your GI tract during the procedure and reduce the bloating. If you had a lower endoscopy (such as a colonoscopy or flexible sigmoidoscopy) you may notice spotting of blood in your stool or on the toilet paper. If you underwent a bowel prep for your procedure, you may not have a normal bowel movement for a few days.  Please Note:  You might notice some irritation and congestion in your nose or some drainage.  This is from the oxygen used during your procedure.  There is no need for concern and it should clear up in a day or so.  SYMPTOMS TO REPORT IMMEDIATELY:   Following lower endoscopy (colonoscopy or flexible sigmoidoscopy):  Excessive amounts of blood in the stool  Significant tenderness or worsening of abdominal pains  Swelling of the abdomen that is new, acute  Fever of 100F or higher    For urgent or emergent issues, a gastroenterologist can be reached at any hour by calling [phone].   DIET:   Drink plenty of fluids but you should avoid alcoholic beverages for 24 hours.  ACTIVITY:  You should plan to take it easy for the rest of today and you should NOT DRIVE or use heavy machinery until tomorrow (because of the  sedation medicines used during the test).    FOLLOW UP: Our staff will call the number listed on your records the next business day following your procedure to check on you and address any questions or concerns that you may have regarding the information given to you following your procedure. If we do not reach you, we will leave a message.  However, if you are feeling well and you are not experiencing any problems, there is no need to return our call.  We will assume that you have returned to your regular daily activities without incident.  If any biopsies were taken you will be contacted by phone or by letter within the next 1-3 weeks.  Please call us at [phone] if you have not heard about the biopsies in 3 weeks.    SIGNATURES/CONFIDENTIALITY: You and/or your care partner have signed paperwork which will be entered into your electronic medical record.  These signatures attest to the fact that that the information above on your After Visit Summary has been reviewed and is understood.  Full responsibility of the confidentiality of this discharge information lies with you and/or your care-partner.

## 2018-04-16 NOTE — Telephone Encounter (Signed)
Referral faxed to [facility] for urgent referral.

## 2018-04-16 NOTE — Progress Notes (Signed)
Called to room to assist during endoscopic procedure.  Patient ID and intended procedure confirmed with present staff. Received instructions for my participation in the procedure from the performing physician.  

## 2018-04-16 NOTE — Op Note (Addendum)
[place] Patient Name: Tara Baxter Procedure Date: 04/16/2018 2:18 PM MRN: [id number] Endoscopist: Ladene Artist , MD Age: 52 Referring MD:  Date of Birth: 28-Oct-1965 Gender: Female Account #: [id number] Procedure:                Colonoscopy Indications:              Abnormal CT of the GI tract, Weight loss Medicines:                Monitored Anesthesia Care Procedure:                Pre-Anesthesia Assessment:                           - Prior to the procedure, a History and Physical                            was performed, and patient medications and                            allergies were reviewed. The patient's tolerance of                            previous anesthesia was also reviewed. The risks                            and benefits of the procedure and the sedation                            options and risks were discussed with the patient.                            All questions were answered, and informed consent                            was obtained. Prior Anticoagulants: The patient has                            taken no previous anticoagulant or antiplatelet                            agents. ASA Grade Assessment: II - A patient with                            mild systemic disease. After reviewing the risks                            and benefits, the patient was deemed in                            satisfactory condition to undergo the procedure.                           After obtaining informed consent, the colonoscope  was passed under direct vision. Throughout the                            procedure, the patient's blood pressure, pulse, and                            oxygen saturations were monitored continuously. The                            Colonoscope was introduced through the anus and                            advanced to the the ascending colon. Limited by an                            obstructing mass. The  rectum was photographed. The                            quality of the bowel preparation was adequate. The                            patient tolerated the procedure well. The                            colonoscopy was somewhat difficult due to a                            tortuous colon. Scope In: 2:29:45 PM Scope Out: 2:57:05 PM Scope Withdrawal Time: 0 hours 22 minutes 30 seconds  Total Procedure Duration: 0 hours 27 minutes 20 seconds  Findings:                 The perianal and digital rectal examinations were                            normal.                           A fungating, obstructing, large mass was found in                            the ascending colon. The mass was circumferential.                            Unable to advance past the mass. It's diameter                            measured thirty mm. No bleeding was present. This                            was biopsied with a cold forceps for histology. Two                            area were tattooed with an injection of 3 mL  of                            Spot (carbon black) just distal to the mass.                           Four sessile polyps were found in the sigmoid colon                            (2), transverse colon (1) and ascending colon (1).                            The polyps were 6 to 8 mm in size. These polyps                            were removed with a cold snare. Resection and                            retrieval were complete.                           The exam was otherwise without abnormality on                            direct and retroflexion views. Complications:            No immediate complications. Estimated blood loss:                            None. Estimated Blood Loss:     Estimated blood loss: none. 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 Biopsied. Tattooed.                           - Four 6 to 8 mm polyps in the  sigmoid colon, in                            the transverse colon and in the hepatic flexure,                            removed with a cold snare. Resected and retrieved.                           - The examination was otherwise normal on direct                            and retroflexion views. Recommendation:           - Repeat colonoscopy in 1 year for surveillance.                           -  Patient has a contact number available for                            emergencies. The signs and symptoms of potential                            delayed complications were discussed with the                            patient. Return to normal activities tomorrow.                            Written discharge instructions were provided to the                            patient.                           - Continue present medications.                           - Await pathology results.                           - Refer to a surgeon at the next available                            appointment.                           - Full liquid diet. Ladene Artist, MD 04/16/2018 3:07:47 PM This report has been signed electronically.

## 2018-04-16 NOTE — Progress Notes (Signed)
Pathology sent rush per D.O. 

## 2018-04-16 NOTE — Telephone Encounter (Signed)
Patient notified that urgent referral has been placed tonight.  She is notified that she will get a call directly from [facility] and that our office will follow up to help facilitate appointment.

## 2018-04-16 NOTE — Progress Notes (Signed)
PT taken to PACU. Monitors in place. VSS. Report given to RN. 

## 2018-04-17 ENCOUNTER — Telehealth: Payer: Self-pay | Admitting: *Deleted

## 2018-04-17 NOTE — Telephone Encounter (Signed)
Pt is returning your call and said she is doing good

## 2018-04-17 NOTE — Telephone Encounter (Signed)
I have spoken to [place] at [facility] to follow up on referral. There was no appointment made for patient after the information we sent over yesterday evening. Claiborne Billings was able to get the patient in to see Dr Dema Severin on Wednesday 04/12/18 at 8:45 am. She needs to bring her ID, insurance cards and $94 copay to the appointment. I have attempted to contact the patient but was unable to reach her. I have left a voicemail for patient to call back. I will continue to try reaching her throughout the afternoon.

## 2018-04-17 NOTE — Telephone Encounter (Signed)
  Follow up Call-  Call back number 04/16/2018  Post procedure Call Back phone  # [phone]  Permission to leave phone message Yes  Some recent data might be hidden     Patient questions:  Do you have a fever, pain , or abdominal swelling? No. Pain Score  0 *  Have you tolerated food without any problems? Yes.    Have you been able to return to your normal activities? Yes.    Do you have any questions about your discharge instructions: Diet   No. Medications  No. Follow up visit  No.  Do you have questions or concerns about your Care? No.  Actions: * If pain score is 4 or above: No action needed, pain <4.

## 2018-04-17 NOTE — Telephone Encounter (Signed)
I have spoken to patient to advise that she has been scheduled with Dr Dema Severin at [place] on 04/22/18 at 845 am. She verbalizes understanding of time/date and location of appointment.

## 2018-04-20 ENCOUNTER — Inpatient Hospital Stay (INDEPENDENT_AMBULATORY_CARE_PROVIDER_SITE_OTHER): Payer: No Typology Code available for payment source | Admitting: Physician Assistant

## 2018-04-22 ENCOUNTER — Ambulatory Visit: Payer: Self-pay | Admitting: Surgery

## 2018-04-22 NOTE — H&P (Signed)
CC: Referred by Dr. Fuller Plan for newly diagnosed colon cancer - ascending colon  HPI: Tara Baxter is a very pleasant 70yoF with no known past medical hx presents to the office today for evaluation of a newly diagnosed colon cancer in the ascending colon. She presented to the hospital 03/26/18 with vague crampy abdominal discomfort and pain with urination. She is found to have a urinary tract infection and treated for this. She had CT scan of the abdomen and pelvis which demonstrated wall thickening of the ascending colon concerning for colitis versus neoplasm. No findings on CT scan are noted for metastatic disease.   She was prescribed antibiotics for her UTI and discharged from the ER. Since that time, she has improved. She was seen in follow-up with GI and underwent a colonoscopy with Dr. Fuller Plan on 04/16/18. This is notable for approximately 46-8 mm polyps in the colon which were removed and benign. She is also found to have a mass in the ascending colon which demonstrated luminal patency but they could not advance the scope past it. This is biopsied and returned adenocarcinoma. She was referred to [place]. Today, she denies abdominal pain, crampy discomfort, nausea, vomiting. She denies any dysuria. She denies any fever/chills. She has had a unintentional 20-30 weight loss over the last 6 months. She denies ever having had a colonoscopy prior.  She has not had a CT scan of her chest She has not had a CEA level checked  PMH: Denies  PSH: Denies new prior surgeries including tubal ligation R C-section  FHx: Denies FHx of malignancy  Social: Previously smoked-quit 5 weeks ago. Denies the use of alcohol or drugs. She works in a school cafeteria  ROS: A comprehensive 10 system review of systems was completed with the patient and pertinent findings as noted above.  The patient is a 52 year old female.   Past Surgical History Tara Baxter, [place]; 04/22/2018 9:15 AM) Colon Polyp Removal -  Colonoscopy   Diagnostic Studies History Tara Baxter, [place]; 04/22/2018 9:15 AM) Colonoscopy  never Pap Smear  1-5 years ago  Allergies Tara Baxter, CMA; 04/22/2018 8:53 AM) No Known Drug Allergies [04/22/2018]: Allergies Reconciled   Medication History Tara Baxter, Tara Baxter; 04/22/2018 8:53 AM) No Current Medications Medications Reconciled  Social History Tara Baxter, [place]; 04/22/2018 9:15 AM) Alcohol use  Occasional alcohol use. Caffeine use  Carbonated beverages, Coffee, Tea. Illicit drug use  Remotely quit drug use. Tobacco use  Current some day smoker.  Family History Tara Baxter, [place]; 04/22/2018 9:15 AM) Heart Disease  Mother. Hypertension  Mother.  Pregnancy / Birth History Tara Baxter, [place]; 04/22/2018 9:15 AM) Age at menarche  31 years. Irregular periods  Maternal age  53-20 Para  2  Other Problems Tara Baxter, [place]; 04/22/2018 8:53 AM) Colon Cancer     Review of Systems Tara Baxter; 04/22/2018 9:16 AM) General Not Present- Chills and Fever. Skin Not Present- Change in Wart/Mole, Dryness, Hives, Jaundice, New Lesions, Non-Healing Wounds, Rash and Ulcer. HEENT Not Present- Earache, Hearing Loss, Hoarseness, Nose Bleed, Oral Ulcers, Ringing in the Ears, Seasonal Allergies, Sinus Pain, Sore Throat, Visual Disturbances, Wears glasses/contact lenses and Yellow Eyes. Respiratory Not Present- Bloody sputum, Chronic Cough, Difficulty Breathing, Snoring and Wheezing. Breast Not Present- Breast Mass, Breast Pain, Nipple Discharge and Skin Changes. Cardiovascular Not Present- Chest Pain, Difficulty Breathing Lying Down, Leg Cramps, Palpitations, Rapid Heart Rate, Shortness of Breath and Swelling of Extremities. Gastrointestinal Not Present- Abdominal Pain, Bloating, Bloody Stool, Change in Bowel Habits,  Chronic diarrhea, Constipation, Difficulty Swallowing, Excessive gas, Gets full quickly at meals, Hemorrhoids, Indigestion, Nausea,  Rectal Pain and Vomiting. Female Genitourinary Not Present- Frequency, Nocturia, Painful Urination, Pelvic Pain and Urgency. Musculoskeletal Not Present- Back Pain, Joint Pain, Joint Stiffness, Muscle Pain, Muscle Weakness and Swelling of Extremities. Neurological Not Present- Decreased Memory, Fainting, Headaches, Numbness, Seizures, Tingling, Tremor, Trouble walking and Weakness. Psychiatric Not Present- Anxiety, Bipolar, Change in Sleep Pattern, Depression, Fearful and Frequent crying. Endocrine Not Present- Cold Intolerance, Excessive Hunger, Hair Changes, Heat Intolerance, Hot flashes and New Diabetes. Hematology Not Present- Blood Thinners, Easy Bruising, Excessive bleeding, Gland problems, HIV and Persistent Infections.  Vitals Tara Baxter CMA; 04/22/2018 8:53 AM) 04/22/2018 8:53 AM Weight: 145.5 lb Height: 68in Body Surface Area: 1.79 m Body Mass Index: 22.12 kg/m  Temp.: 98.35F(Oral)  Pulse: 108 (Regular)  BP: 130/82 (Sitting, Left Arm, Standard)       Physical Exam Tara Baxter; 04/22/2018 9:16 AM) The physical exam findings are as follows: Note:Constitutional: No acute distress; conversant; no deformities Eyes: Moist conjunctiva; no lid lag; anicteric sclerae; pupils equal round and reactive to light Neck: Trachea midline; no palpable thyromegaly Lungs: Normal respiratory effort; no tactile fremitus CV: Regular rate and rhythm; no palpable thrill; no pitting edema GI: Abdomen soft, nontender, nondistended; no palpable hepatosplenomegaly MSK: Normal gait; no clubbing/cyanosis Psychiatric: Appropriate affect; alert and oriented 3 Lymphatic: No palpable cervical or axillary lymphadenopathy    Assessment & Plan Tara Baxter; 04/22/2018 9:20 AM) COLON CANCER, ASCENDING (C18.2) Story: Ms. Klawitter is a very pleasant 83yoF with no known past medical hx here today for newly diagnosed adenocarcinoma of the ascending colon -C scope  04/16/18 - adenoCA in ascending colon - partially obstructing (patent, couldn't advance scope past though) -CT A/P 03/26/18 - mass/thickening of ascending colon; no evidence of metastatic disease in abdomen Impression: -We'll obtain an urgent CT chest with IV contrast -CEA to be checked with preoperative labs -Assuming CT chest negative for metastatic disease, we discussed options moving forward - observation/nonoperative with risk of obstruction and subsequent perforation/death. We also discussed surgery. We discussed laparoscopic and open techniques. I recommended a right hemicolectomy. -The anatomy and physiology of the GI tract was discussed at length with the patient. The pathophysiology of colon cancer was discussed at length with associated pictures. -The planned procedure, material risks (including, but not limited to, pain, bleeding, infection, scarring, need for blood transfusion, damage to surrounding structures- blood vessels/nerves/viscus/organs, damage to ureter, urine leak, leak from anastomosis, need for additional procedures, need for stoma which may be permanent, hernia, recurrence of cancer, pneumonia, heart attack, stroke, death) benefits and alternatives to surgery were discussed at length. The patient's questions were answered to her satisfaction, she voiced understanding and elected to proceed with surgery. Additionally, we discussed typical postoperative expectations and the recovery process - we also discussed possibility of need for chemotherapy postoperatively based on pathology.  Signed electronically by Ileana Roup, Baxter (04/22/2018 9:20 AM)

## 2018-04-24 ENCOUNTER — Other Ambulatory Visit: Payer: Self-pay | Admitting: Surgery

## 2018-04-24 ENCOUNTER — Ambulatory Visit
Admission: RE | Admit: 2018-04-24 | Discharge: 2018-04-24 | Disposition: A | Discharge status: Home / Self Care | Payer: BC Managed Care – PPO | Source: Ambulatory Visit | Attending: Surgery | Admitting: Surgery

## 2018-04-24 DIAGNOSIS — C182 Malignant neoplasm of ascending colon: Secondary | ICD-10-CM

## 2018-04-24 MED ORDER — IOPAMIDOL (ISOVUE-300) INJECTION 61%
75.0000 mL | Freq: Once | INTRAVENOUS | Status: AC | PRN
  Administered 2018-04-24: 75 mL via INTRAVENOUS

## 2018-05-10 DIAGNOSIS — C801 Malignant (primary) neoplasm, unspecified: Secondary | ICD-10-CM

## 2018-05-10 HISTORY — DX: Malignant (primary) neoplasm, unspecified: C80.1

## 2018-05-12 ENCOUNTER — Telehealth (INDEPENDENT_AMBULATORY_CARE_PROVIDER_SITE_OTHER): Payer: Self-pay | Admitting: Obstetrics and Gynecology

## 2018-05-12 NOTE — Patient Instructions (Addendum)
Tara Baxter  05/12/2018   Your procedure is scheduled on: 05/15/2018   Report to [facility] Main  Entrance  Report to admitting at    0830 AM    Call this number if you have problems the morning of surgery [phone]   Remember: Do not eat food or drink liquids :After Midnight. BRUSH YOUR TEETH MORNING OF SURGERY AND RINSE YOUR MOUTH OUT, NO CHEWING GUM CANDY OR MINTS.  Clear liquid diet on the day of your bowel prep.     Take these medicines the morning of surgery with A SIP OF WATER:  None                                 You may not have any metal on your body including hair pins and              piercings  Do not wear jewelry, make-up, lotions, powders or perfumes, deodorant             Do not wear nail polish.  Do not shave  48 hours prior to surgery.                Do not bring valuables to the hospital. [place].  Contacts, dentures or bridgework may not be worn into surgery.  Leave suitcase in the car. After surgery it may be brought to your room.     :                Please read over the following fact sheets you were given: _____________________________________________________________________  DRINK 2 PRESURGERY ENSURE DRINKS THE NIGHT BEFORE SURGERY AT  1000 PM AND 1 PRESURGERY DRINK THE DAY OF THE PROCEDURE 3 HOURS PRIOR TO SCHEDULED SURGERY. NO SOLIDS AFTER MIDNIGHT THE DAY PRIOR TO THE SURGERY. NOTHING BY MOUTH EXCEPT CLEAR LIQUIDS UNTIL THREE HOURS PRIOR TO SCHEDULED SURGERY. PLEASE FINISH PRESURGERY ENSURE DRINK PER SURGEON ORDER 3 HOURS PRIOR TO SCHEDULED SURGERY TIME WHICH NEEDS TO BE COMPLETED AT _________. NO SOLID FOOD AFTER MIDNIGHT THE NIGHT PRIOR TO SURGERY. NOTHING BY MOUTH EXCEPT CLEAR LIQUIDS UNTIL 3 HOURS PRIOR TO Adrian SURGERY. PLEASE FINISH ENSURE DRINK PER SURGEON ORDER 3 HOURS PRIOR TO SCHEDULED SURGERY TIME WHICH NEEDS TO BE COMPLETED AT ___0730am_________.   CLEAR  LIQUID DIET   Foods Allowed                                                                     Foods Excluded  Coffee and tea, regular and decaf                             liquids that you cannot  Plain Jell-O in any flavor  see through such as: Fruit ices (not with fruit pulp)                                     milk, soups, orange juice  Iced Popsicles                                    All solid food Carbonated beverages, regular and diet                                    Cranberry, grape and apple juices Sports drinks like Gatorade Lightly seasoned clear broth or consume(fat free) Sugar, honey syrup  Sample Menu Breakfast                                Lunch                                     Supper Cranberry juice                    Beef broth                            Chicken broth Jell-O                                     Grape juice                           Apple juice Coffee or tea                        Jell-O                                      Popsicle                                                Coffee or tea                        Coffee or tea  _____________________________________________________________________  [facility] - Preparing for Surgery Before surgery, you can play an important role.  Because skin is not sterile, your skin needs to be as free of germs as possible.  You can reduce the number of germs on your skin by washing with CHG (chlorahexidine gluconate) soap before surgery.  CHG is an antiseptic cleaner which kills germs and bonds with the skin to continue killing germs even after washing. Please DO NOT use if you have an allergy to CHG or antibacterial soaps.  If your skin becomes reddened/irritated stop using the CHG and inform your nurse when you arrive at Short Stay. Do not shave (including legs and underarms) for  at least 48 hours prior to the first CHG shower.  You may shave your face/neck. Please  follow these instructions carefully:  1.  Shower with CHG Soap the night before surgery and the  morning of Surgery.  2.  If you choose to wash your hair, wash your hair first as usual with your  normal  shampoo.  3.  After you shampoo, rinse your hair and body thoroughly to remove the  shampoo.                           4.  Use CHG as you would any other liquid soap.  You can apply chg directly  to the skin and wash                       Gently with a scrungie or clean washcloth.  5.  Apply the CHG Soap to your body ONLY FROM THE NECK DOWN.   Do not use on face/ open                           Wound or open sores. Avoid contact with eyes, ears mouth and genitals (private parts).                       Wash face,  Genitals (private parts) with your normal soap.             6.  Wash thoroughly, paying special attention to the area where your surgery  will be performed.  7.  Thoroughly rinse your body with warm water from the neck down.  8.  DO NOT shower/wash with your normal soap after using and rinsing off  the CHG Soap.                9.  Pat yourself dry with a clean towel.            10.  Wear clean pajamas.            11.  Place clean sheets on your bed the night of your first shower and do not  sleep with pets. Day of Surgery : Do not apply any lotions/deodorants the morning of surgery.  Please wear clean clothes to the hospital/surgery center.  FAILURE TO FOLLOW THESE INSTRUCTIONS MAY RESULT IN THE CANCELLATION OF YOUR SURGERY PATIENT SIGNATURE_________________________________  NURSE SIGNATURE__________________________________  ________________________________________________________________________  WHAT IS A BLOOD TRANSFUSION? Blood Transfusion Information  A transfusion is the replacement of blood or some of its parts. Blood is made up of multiple cells which provide different functions.  Red blood cells carry oxygen and are used for blood loss replacement.  White blood cells  fight against infection.  Platelets control bleeding.  Plasma helps clot blood.  Other blood products are available for specialized needs, such as hemophilia or other clotting disorders. BEFORE THE TRANSFUSION  Who gives blood for transfusions?   Healthy volunteers who are fully evaluated to make sure their blood is safe. This is blood bank blood. Transfusion therapy is the safest it has ever been in the practice of medicine. Before blood is taken from a donor, a complete history is taken to make sure that person has no history of diseases nor engages in risky social behavior (examples are intravenous drug use or sexual activity with multiple partners). The donor's travel history is screened to minimize risk of transmitting  infections, such as malaria. The donated blood is tested for signs of infectious diseases, such as HIV and hepatitis. The blood is then tested to be sure it is compatible with you in order to minimize the chance of a transfusion reaction. If you or a relative donates blood, this is often done in anticipation of surgery and is not appropriate for emergency situations. It takes many days to process the donated blood. RISKS AND COMPLICATIONS Although transfusion therapy is very safe and saves many lives, the main dangers of transfusion include:   Getting an infectious disease.  Developing a transfusion reaction. This is an allergic reaction to something in the blood you were given. Every precaution is taken to prevent this. The decision to have a blood transfusion has been considered carefully by your caregiver before blood is given. Blood is not given unless the benefits outweigh the risks. AFTER THE TRANSFUSION  Right after receiving a blood transfusion, you will usually feel much better and more energetic. This is especially true if your red blood cells have gotten low (anemic). The transfusion raises the level of the red blood cells which carry oxygen, and this usually  causes an energy increase.  The nurse administering the transfusion will monitor you carefully for complications. HOME CARE INSTRUCTIONS  No special instructions are needed after a transfusion. You may find your energy is better. Speak with your caregiver about any limitations on activity for underlying diseases you may have. SEEK MEDICAL CARE IF:   Your condition is not improving after your transfusion.  You develop redness or irritation at the intravenous (IV) site. SEEK IMMEDIATE MEDICAL CARE IF:  Any of the following symptoms occur over the next 12 hours:  Shaking chills.  You have a temperature by mouth above 102 F (38.9 C), not controlled by medicine.  Chest, back, or muscle pain.  People around you feel you are not acting correctly or are confused.  Shortness of breath or difficulty breathing.  Dizziness and fainting.  You get a rash or develop hives.  You have a decrease in urine output.  Your urine turns a dark color or changes to pink, red, or brown. Any of the following symptoms occur over the next 10 days:  You have a temperature by mouth above 102 F (38.9 C), not controlled by medicine.  Shortness of breath.  Weakness after normal activity.  The white part of the eye turns yellow (jaundice).  You have a decrease in the amount of urine or are urinating less often.  Your urine turns a dark color or changes to pink, red, or brown. Document Released: 05/24/2000 Document Revised: 08/19/2011 Document Reviewed: 01/11/2008 Exit[employer] Patient Information 2014 [place].  _______________________________________________________________________  Incentive Spirometer  An incentive spirometer is a tool that can help keep your lungs clear and active. This tool measures how well you are filling your lungs with each breath. Taking long deep breaths may help reverse or decrease the chance of developing breathing (pulmonary) problems (especially infection)  following:  A long period of time when you are unable to move or be active. BEFORE THE PROCEDURE   If the spirometer includes an indicator to show your best effort, your nurse or respiratory therapist will set it to a desired goal.  If possible, sit up straight or lean slightly forward. Try not to slouch.  Hold the incentive spirometer in an upright position. INSTRUCTIONS FOR USE  1. Sit on the edge of your bed if possible, or sit up as far  as you can in bed or on a chair. 2. Hold the incentive spirometer in an upright position. 3. Breathe out normally. 4. Place the mouthpiece in your mouth and seal your lips tightly around it. 5. Breathe in slowly and as deeply as possible, raising the piston or the ball toward the top of the column. 6. Hold your breath for 3-5 seconds or for as long as possible. Allow the piston or ball to fall to the bottom of the column. 7. Remove the mouthpiece from your mouth and breathe out normally. 8. Rest for a few seconds and repeat Steps 1 through 7 at least 10 times every 1-2 hours when you are awake. Take your time and take a few normal breaths between deep breaths. 9. The spirometer may include an indicator to show your best effort. Use the indicator as a goal to work toward during each repetition. 10. After each set of 10 deep breaths, practice coughing to be sure your lungs are clear. If you have an incision (the cut made at the time of surgery), support your incision when coughing by placing a pillow or rolled up towels firmly against it. Once you are able to get out of bed, walk around indoors and cough well. You may stop using the incentive spirometer when instructed by your caregiver.  RISKS AND COMPLICATIONS  Take your time so you do not get dizzy or light-headed.  If you are in pain, you may need to take or ask for pain medication before doing incentive spirometry. It is harder to take a deep breath if you are having pain. AFTER USE  Rest and  breathe slowly and easily.  It can be helpful to keep track of a log of your progress. Your caregiver can provide you with a simple table to help with this. If you are using the spirometer at home, follow these instructions: [place] IF:   You are having difficultly using the spirometer.  You have trouble using the spirometer as often as instructed.  Your pain medication is not giving enough relief while using the spirometer.  You develop fever of 100.5 F (38.1 C) or higher. SEEK IMMEDIATE MEDICAL CARE IF:   You cough up bloody sputum that had not been present before.  You develop fever of 102 F (38.9 C) or greater.  You develop worsening pain at or near the incision site. MAKE SURE YOU:   Understand these instructions.  Will watch your condition.  Will get help right away if you are not doing well or get worse. Document Released: 10/07/2006 Document Revised: 08/19/2011 Document Reviewed: 12/08/2006 [facility] Patient Information 2014 [place], [place].   ________________________________________________________________________

## 2018-05-12 NOTE — Telephone Encounter (Signed)
She will not need to return. Do the pre-op blood work.

## 2018-05-12 NOTE — Telephone Encounter (Signed)
Patient called stating that she is having surgery done on Friday Dec 6 at 10:30 am but is having blood work done tomorrow at [facility] at 8:30 am. Patient wanted to know if she still had to come in to get her blood drawn to retest for anemia.  Please advice [phone]  Thank you Emmit Pomfret

## 2018-05-12 NOTE — Telephone Encounter (Signed)
FWD to PCP. Tamim Skog S Rahma Meller, CMA  

## 2018-05-13 ENCOUNTER — Encounter (HOSPITAL_COMMUNITY)
Admission: RE | Admit: 2018-05-13 | Discharge: 2018-05-13 | Disposition: A | Discharge status: Home / Self Care | Payer: BC Managed Care – PPO | Source: Ambulatory Visit | Attending: Surgery | Admitting: Surgery

## 2018-05-13 ENCOUNTER — Encounter (HOSPITAL_COMMUNITY): Payer: Self-pay

## 2018-05-13 ENCOUNTER — Other Ambulatory Visit: Payer: Self-pay

## 2018-05-13 DIAGNOSIS — Z01818 Encounter for other preprocedural examination: Secondary | ICD-10-CM | POA: Diagnosis not present

## 2018-05-13 LAB — CBC WITH DIFFERENTIAL/PLATELET
Abs Immature Granulocytes: 0.01 10*3/uL (ref 0.00–0.07)
Basophils Absolute: 0 10*3/uL (ref 0.0–0.1)
Basophils Relative: 1 %
Eosinophils Absolute: 0.1 10*3/uL (ref 0.0–0.5)
Eosinophils Relative: 3 %
HCT: 31.6 % — ABNORMAL LOW (ref 36.0–46.0)
Hemoglobin: 9.6 g/dL — ABNORMAL LOW (ref 12.0–15.0)
IMMATURE GRANULOCYTES: 0 %
Lymphocytes Relative: 36 %
Lymphs Abs: 1.6 10*3/uL (ref 0.7–4.0)
MCH: 24.6 pg — ABNORMAL LOW (ref 26.0–34.0)
MCHC: 30.4 g/dL (ref 30.0–36.0)
MCV: 81 fL (ref 80.0–100.0)
MONOS PCT: 9 %
Monocytes Absolute: 0.4 10*3/uL (ref 0.1–1.0)
Neutro Abs: 2.3 10*3/uL (ref 1.7–7.7)
Neutrophils Relative %: 51 %
Platelets: 460 10*3/uL — ABNORMAL HIGH (ref 150–400)
RBC: 3.9 MIL/uL (ref 3.87–5.11)
RDW: 18.5 % — ABNORMAL HIGH (ref 11.5–15.5)
WBC: 4.4 10*3/uL (ref 4.0–10.5)
nRBC: 0 % (ref 0.0–0.2)

## 2018-05-13 LAB — COMPREHENSIVE METABOLIC PANEL
ALT: 19 U/L (ref 0–44)
AST: 22 U/L (ref 15–41)
Albumin: 3.6 g/dL (ref 3.5–5.0)
Alkaline Phosphatase: 64 U/L (ref 38–126)
Anion gap: 7 (ref 5–15)
BUN: 8 mg/dL (ref 6–20)
CO2: 26 mmol/L (ref 22–32)
Calcium: 9.2 mg/dL (ref 8.9–10.3)
Chloride: 106 mmol/L (ref 98–111)
Creatinine, Ser: 0.47 mg/dL (ref 0.44–1.00)
GFR calc Af Amer: >60 mL/min (ref >60)
GFR calc non Af Amer: >60 mL/min (ref >60)
Glucose, Bld: 83 mg/dL (ref 70–99)
POTASSIUM: 4.1 mmol/L (ref 3.5–5.1)
Sodium: 139 mmol/L (ref 135–145)
Total Bilirubin: 0.5 mg/dL (ref 0.3–1.2)
Total Protein: 7.1 g/dL (ref 6.5–8.1)

## 2018-05-13 LAB — HEMOGLOBIN A1C
Hgb A1c MFr Bld: 5.5 % (ref 4.8–5.6)
Mean Plasma Glucose: 111.15 mg/dL

## 2018-05-13 LAB — PROTIME-INR
INR: 0.94
Prothrombin Time: 12.5 seconds (ref 11.4–15.2)

## 2018-05-13 LAB — APTT: aPTT: 31 seconds (ref 24–36)

## 2018-05-13 LAB — ABO/RH: ABO/RH(D): B POS

## 2018-05-13 MED ORDER — CHLORHEXIDINE GLUCONATE CLOTH 2 % EX PADS
6.0000 | MEDICATED_PAD | Freq: Once | CUTANEOUS | Status: DC
  Filled 2018-05-13: qty 6

## 2018-05-13 NOTE — Progress Notes (Signed)
CBC done 05/13/2018 routed via epic to Dr Dema Severin.

## 2018-05-13 NOTE — Progress Notes (Signed)
Called office of [facility] and spoke with Sunday Spillers in Triage Dr Dema Severin is in office today and told her that I had routed to MD the CBC results of 05/13/2018 to him but wanted to make sure he was aware.  Sunday Spillers stated she would send MD message

## 2018-05-14 LAB — CEA: CEA: 212 ng/mL — ABNORMAL HIGH (ref 0.0–4.7)

## 2018-05-14 MED ORDER — BUPIVACAINE LIPOSOME 1.3 % IJ SUSP
20.0000 mL | Freq: Once | INTRAMUSCULAR | Status: DC
  Filled 2018-05-14: qty 20

## 2018-05-14 NOTE — Progress Notes (Signed)
Final EKG done 05/13/2018-epic  

## 2018-05-14 NOTE — Progress Notes (Signed)
CEA done 05/13/2018 routed via epic to Dr Dema Severin.

## 2018-05-15 ENCOUNTER — Encounter (HOSPITAL_COMMUNITY)
Admission: RE | Disposition: A | Discharge status: Home / Self Care | Payer: Self-pay | Source: Ambulatory Visit | Attending: Surgery

## 2018-05-15 ENCOUNTER — Other Ambulatory Visit: Payer: Self-pay

## 2018-05-15 ENCOUNTER — Inpatient Hospital Stay (HOSPITAL_COMMUNITY)
Admission: RE | Admit: 2018-05-15 | Discharge: 2018-05-19 | DRG: 330 | Disposition: A | Discharge status: Home / Self Care | Payer: BC Managed Care – PPO | Source: Ambulatory Visit | Attending: Surgery | Admitting: Surgery

## 2018-05-15 ENCOUNTER — Inpatient Hospital Stay (HOSPITAL_COMMUNITY): Payer: BC Managed Care – PPO | Admitting: Anesthesiology

## 2018-05-15 ENCOUNTER — Encounter (HOSPITAL_COMMUNITY): Payer: Self-pay | Admitting: Anesthesiology

## 2018-05-15 DIAGNOSIS — E440 Moderate protein-calorie malnutrition: Secondary | ICD-10-CM | POA: Diagnosis present

## 2018-05-15 DIAGNOSIS — D649 Anemia, unspecified: Secondary | ICD-10-CM | POA: Diagnosis present

## 2018-05-15 DIAGNOSIS — Z6821 Body mass index (BMI) 21.0-21.9, adult: Secondary | ICD-10-CM

## 2018-05-15 DIAGNOSIS — C772 Secondary and unspecified malignant neoplasm of intra-abdominal lymph nodes: Secondary | ICD-10-CM | POA: Diagnosis present

## 2018-05-15 DIAGNOSIS — C182 Malignant neoplasm of ascending colon: Secondary | ICD-10-CM | POA: Diagnosis present

## 2018-05-15 DIAGNOSIS — K567 Ileus, unspecified: Secondary | ICD-10-CM | POA: Diagnosis not present

## 2018-05-15 DIAGNOSIS — F17200 Nicotine dependence, unspecified, uncomplicated: Secondary | ICD-10-CM | POA: Diagnosis present

## 2018-05-15 HISTORY — PX: ROBOT ASSISTED LAPAROSCOPIC PARTIAL COLECTOMY: SHX6476

## 2018-05-15 SURGERY — COLECTOMY, PARTIAL, ROBOT-ASSISTED, LAPAROSCOPIC
Anesthesia: General | Site: Abdomen | Laterality: Right

## 2018-05-15 MED ORDER — SODIUM CHLORIDE 0.9 % IV SOLN
2.0000 g | INTRAVENOUS | Status: AC
  Administered 2018-05-15: 2 g via INTRAVENOUS
  Filled 2018-05-15: qty 2

## 2018-05-15 MED ORDER — HEPARIN SODIUM (PORCINE) 5000 UNIT/ML IJ SOLN
5000.0000 [IU] | Freq: Once | INTRAMUSCULAR | Status: AC
  Administered 2018-05-15: 5000 [IU] via SUBCUTANEOUS
  Filled 2018-05-15: qty 1

## 2018-05-15 MED ORDER — SUGAMMADEX SODIUM 200 MG/2ML IV SOLN
INTRAVENOUS | Status: AC
  Filled 2018-05-15: qty 2

## 2018-05-15 MED ORDER — PROPOFOL 10 MG/ML IV BOLUS
INTRAVENOUS | Status: DC | PRN
  Administered 2018-05-15: 150 mg via INTRAVENOUS

## 2018-05-15 MED ORDER — FENTANYL CITRATE (PF) 100 MCG/2ML IJ SOLN
INTRAMUSCULAR | Status: AC
  Filled 2018-05-15: qty 2

## 2018-05-15 MED ORDER — BUPIVACAINE LIPOSOME 1.3 % IJ SUSP
INTRAMUSCULAR | Status: DC | PRN
  Administered 2018-05-15: 20 mL

## 2018-05-15 MED ORDER — ESMOLOL HCL 100 MG/10ML IV SOLN
INTRAVENOUS | Status: DC | PRN
  Administered 2018-05-15: 10 mg via INTRAVENOUS

## 2018-05-15 MED ORDER — SCOPOLAMINE 1 MG/3DAYS TD PT72
MEDICATED_PATCH | TRANSDERMAL | Status: AC
  Administered 2018-05-15: 1.5 mg via TRANSDERMAL
  Filled 2018-05-15: qty 1

## 2018-05-15 MED ORDER — HYDROMORPHONE HCL 1 MG/ML IJ SOLN
0.5000 mg | INTRAMUSCULAR | Status: DC | PRN
  Administered 2018-05-16 (×2): 0.5 mg via INTRAVENOUS
  Filled 2018-05-15 (×2): qty 0.5

## 2018-05-15 MED ORDER — LIDOCAINE 2% (20 MG/ML) 5 ML SYRINGE
INTRAMUSCULAR | Status: DC | PRN
  Administered 2018-05-15: 100 mg via INTRAVENOUS

## 2018-05-15 MED ORDER — HYDROMORPHONE HCL 1 MG/ML IJ SOLN
INTRAMUSCULAR | Status: AC
  Filled 2018-05-15: qty 1

## 2018-05-15 MED ORDER — SUGAMMADEX SODIUM 200 MG/2ML IV SOLN
INTRAVENOUS | Status: DC | PRN
  Administered 2018-05-15: 150 mg via INTRAVENOUS

## 2018-05-15 MED ORDER — ONDANSETRON HCL 4 MG/2ML IJ SOLN
2.0000 mg | Freq: Four times a day (QID) | INTRAMUSCULAR | Status: DC | PRN
  Administered 2018-05-15 – 2018-05-16 (×2): 4 mg via INTRAVENOUS
  Administered 2018-05-16: 2 mg via INTRAVENOUS
  Filled 2018-05-15 (×4): qty 2

## 2018-05-15 MED ORDER — IBUPROFEN 200 MG PO TABS: 600.0000 mg | ORAL_TABLET | Freq: Four times a day (QID) | ORAL | Status: DC | PRN

## 2018-05-15 MED ORDER — DEXAMETHASONE SODIUM PHOSPHATE 10 MG/ML IJ SOLN
INTRAMUSCULAR | Status: DC | PRN
  Administered 2018-05-15: 10 mg via INTRAVENOUS

## 2018-05-15 MED ORDER — SACCHAROMYCES BOULARDII 250 MG PO CAPS
250.0000 mg | ORAL_CAPSULE | Freq: Two times a day (BID) | ORAL | Status: DC
  Administered 2018-05-15 – 2018-05-19 (×7): 250 mg via ORAL
  Filled 2018-05-15 (×7): qty 1

## 2018-05-15 MED ORDER — HEPARIN SODIUM (PORCINE) 5000 UNIT/ML IJ SOLN
5000.0000 [IU] | Freq: Three times a day (TID) | INTRAMUSCULAR | Status: DC
  Administered 2018-05-15 – 2018-05-19 (×11): 5000 [IU] via SUBCUTANEOUS
  Filled 2018-05-15 (×11): qty 1

## 2018-05-15 MED ORDER — 0.9 % SODIUM CHLORIDE (POUR BTL) OPTIME
TOPICAL | Status: DC | PRN
  Administered 2018-05-15: 2000 mL

## 2018-05-15 MED ORDER — TRAMADOL HCL 50 MG PO TABS
50.0000 mg | ORAL_TABLET | Freq: Four times a day (QID) | ORAL | Status: DC | PRN
  Administered 2018-05-15 – 2018-05-16 (×2): 50 mg via ORAL
  Filled 2018-05-15 (×2): qty 1

## 2018-05-15 MED ORDER — PROMETHAZINE HCL 25 MG/ML IJ SOLN: 6.2500 mg | INTRAMUSCULAR | Status: DC | PRN

## 2018-05-15 MED ORDER — ALVIMOPAN 12 MG PO CAPS
12.0000 mg | ORAL_CAPSULE | Freq: Two times a day (BID) | ORAL | Status: DC
  Administered 2018-05-16 – 2018-05-17 (×2): 12 mg via ORAL
  Filled 2018-05-15 (×3): qty 1

## 2018-05-15 MED ORDER — MIDAZOLAM HCL 2 MG/2ML IJ SOLN
INTRAMUSCULAR | Status: AC
  Filled 2018-05-15: qty 2

## 2018-05-15 MED ORDER — LIP MEDEX EX OINT
TOPICAL_OINTMENT | CUTANEOUS | Status: AC
  Filled 2018-05-15: qty 7

## 2018-05-15 MED ORDER — KETOROLAC TROMETHAMINE 30 MG/ML IJ SOLN: 30.0000 mg | Freq: Once | INTRAMUSCULAR | Status: DC | PRN

## 2018-05-15 MED ORDER — ACETAMINOPHEN 500 MG PO TABS
1000.0000 mg | ORAL_TABLET | ORAL | Status: AC
  Administered 2018-05-15: 1000 mg via ORAL
  Filled 2018-05-15: qty 2

## 2018-05-15 MED ORDER — LACTATED RINGERS IV SOLN
INTRAVENOUS | Status: DC
  Administered 2018-05-15 (×2): via INTRAVENOUS

## 2018-05-15 MED ORDER — LACTATED RINGERS IV SOLN
INTRAVENOUS | Status: DC
  Administered 2018-05-15: 18:00:00 via INTRAVENOUS

## 2018-05-15 MED ORDER — HYDROMORPHONE HCL 1 MG/ML IJ SOLN
0.2500 mg | INTRAMUSCULAR | Status: DC | PRN
  Administered 2018-05-15: 0.25 mg via INTRAVENOUS
  Administered 2018-05-15: 0.5 mg via INTRAVENOUS
  Administered 2018-05-15: 0.25 mg via INTRAVENOUS
  Administered 2018-05-15: 0.5 mg via INTRAVENOUS

## 2018-05-15 MED ORDER — KETAMINE HCL 10 MG/ML IJ SOLN
INTRAMUSCULAR | Status: DC | PRN
  Administered 2018-05-15 (×2): 10 mg via INTRAVENOUS
  Administered 2018-05-15: 20 mg via INTRAVENOUS
  Administered 2018-05-15: 10 mg via INTRAVENOUS

## 2018-05-15 MED ORDER — PROPOFOL 10 MG/ML IV BOLUS
INTRAVENOUS | Status: AC
  Filled 2018-05-15: qty 20

## 2018-05-15 MED ORDER — ALVIMOPAN 12 MG PO CAPS
12.0000 mg | ORAL_CAPSULE | ORAL | Status: AC
  Administered 2018-05-15: 12 mg via ORAL
  Filled 2018-05-15: qty 1

## 2018-05-15 MED ORDER — SCOPOLAMINE 1 MG/3DAYS TD PT72
1.0000 | MEDICATED_PATCH | TRANSDERMAL | Status: DC
  Administered 2018-05-15: 1.5 mg via TRANSDERMAL

## 2018-05-15 MED ORDER — TRAMADOL HCL 50 MG PO TABS: 50.0000 mg | ORAL_TABLET | Freq: Four times a day (QID) | ORAL | 0 refills | Status: AC | PRN

## 2018-05-15 MED ORDER — BUPIVACAINE-EPINEPHRINE (PF) 0.25% -1:200000 IJ SOLN
INTRAMUSCULAR | Status: AC
  Filled 2018-05-15: qty 30

## 2018-05-15 MED ORDER — ROCURONIUM BROMIDE 10 MG/ML (PF) SYRINGE
PREFILLED_SYRINGE | INTRAVENOUS | Status: DC | PRN
  Administered 2018-05-15 (×2): 20 mg via INTRAVENOUS
  Administered 2018-05-15: 50 mg via INTRAVENOUS
  Administered 2018-05-15: 10 mg via INTRAVENOUS

## 2018-05-15 MED ORDER — GABAPENTIN 300 MG PO CAPS
300.0000 mg | ORAL_CAPSULE | ORAL | Status: AC
  Administered 2018-05-15: 300 mg via ORAL
  Filled 2018-05-15: qty 1

## 2018-05-15 MED ORDER — LIDOCAINE 20MG/ML (2%) 15 ML SYRINGE OPTIME
INTRAMUSCULAR | Status: DC | PRN
  Administered 2018-05-15: 1.5 mg/kg/h via INTRAVENOUS

## 2018-05-15 MED ORDER — ONDANSETRON HCL 4 MG/2ML IJ SOLN
INTRAMUSCULAR | Status: DC | PRN
  Administered 2018-05-15 (×2): 4 mg via INTRAVENOUS

## 2018-05-15 MED ORDER — FENTANYL CITRATE (PF) 100 MCG/2ML IJ SOLN
INTRAMUSCULAR | Status: DC | PRN
  Administered 2018-05-15 (×4): 50 ug via INTRAVENOUS

## 2018-05-15 MED ORDER — POLYETHYLENE GLYCOL 3350 17 GM/SCOOP PO POWD: 1.0000 | Freq: Once | ORAL | Status: DC

## 2018-05-15 MED ORDER — MIDAZOLAM HCL 5 MG/5ML IJ SOLN
INTRAMUSCULAR | Status: DC | PRN
  Administered 2018-05-15: 2 mg via INTRAVENOUS

## 2018-05-15 MED ORDER — KETAMINE HCL 10 MG/ML IJ SOLN
INTRAMUSCULAR | Status: AC
  Filled 2018-05-15: qty 1

## 2018-05-15 MED ORDER — BUPIVACAINE-EPINEPHRINE (PF) 0.25% -1:200000 IJ SOLN
INTRAMUSCULAR | Status: DC | PRN
  Administered 2018-05-15: 30 mL

## 2018-05-15 MED ORDER — ACETAMINOPHEN 500 MG PO TABS
1000.0000 mg | ORAL_TABLET | Freq: Four times a day (QID) | ORAL | Status: DC
  Administered 2018-05-15 – 2018-05-19 (×11): 1000 mg via ORAL
  Filled 2018-05-15 (×13): qty 2

## 2018-05-15 MED ORDER — ENSURE SURGERY PO LIQD
237.0000 mL | Freq: Two times a day (BID) | ORAL | Status: DC
  Administered 2018-05-19: 237 mL via ORAL
  Filled 2018-05-15 (×8): qty 237

## 2018-05-15 SURGICAL SUPPLY — 120 items
ADH SKN CLS APL DERMABOND .7 (GAUZE/BANDAGES/DRESSINGS) ×1
APPLIER CLIP 5 13 M/L LIGAMAX5 (MISCELLANEOUS)
APPLIER CLIP ROT 10 11.4 M/L (STAPLE)
APR CLP MED LRG 11.4X10 (STAPLE)
APR CLP MED LRG 5 ANG JAW (MISCELLANEOUS)
BLADE EXTENDED COATED 6.5IN (ELECTRODE) ×3 IMPLANT
CANNULA REDUC XI 12-8 STAPL (CANNULA)
CANNULA REDUC XI 12-8MM STAPL (CANNULA)
CANNULA REDUCER 12-8 DVNC XI (CANNULA) ×1 IMPLANT
CELLS DAT CNTRL 66122 CELL SVR (MISCELLANEOUS) IMPLANT
CHLORAPREP W/TINT 26ML (MISCELLANEOUS) ×3 IMPLANT
CLIP APPLIE 5 13 M/L LIGAMAX5 (MISCELLANEOUS) IMPLANT
CLIP APPLIE ROT 10 11.4 M/L (STAPLE) IMPLANT
CLIP VESOLOCK LG 6/CT PURPLE (CLIP) IMPLANT
CLIP VESOLOCK MED LG 6/CT (CLIP) IMPLANT
COVER SURGICAL LIGHT HANDLE (MISCELLANEOUS) ×6 IMPLANT
COVER TIP SHEARS 8 DVNC (MISCELLANEOUS) ×1 IMPLANT
COVER TIP SHEARS 8MM DA VINCI (MISCELLANEOUS) ×2
COVER WAND RF STERILE (DRAPES) ×2 IMPLANT
DECANTER SPIKE VIAL GLASS SM (MISCELLANEOUS) ×3 IMPLANT
DERMABOND ADVANCED (GAUZE/BANDAGES/DRESSINGS) ×2
DERMABOND ADVANCED .7 DNX12 (GAUZE/BANDAGES/DRESSINGS) IMPLANT
DEVICE TROCAR PUNCTURE CLOSURE (ENDOMECHANICALS) IMPLANT
DRAIN CHANNEL 19F RND (DRAIN) ×1 IMPLANT
DRAPE ARM DVNC X/XI (DISPOSABLE) ×4 IMPLANT
DRAPE COLUMN DVNC XI (DISPOSABLE) ×1 IMPLANT
DRAPE DA VINCI XI ARM (DISPOSABLE) ×8
DRAPE DA VINCI XI COLUMN (DISPOSABLE) ×2
DRAPE SURG IRRIG POUCH 19X23 (DRAPES) ×1 IMPLANT
DRSG OPSITE POSTOP 4X10 (GAUZE/BANDAGES/DRESSINGS) IMPLANT
DRSG OPSITE POSTOP 4X6 (GAUZE/BANDAGES/DRESSINGS) ×2 IMPLANT
DRSG OPSITE POSTOP 4X8 (GAUZE/BANDAGES/DRESSINGS) IMPLANT
DRSG TEGADERM 2-3/8X2-3/4 SM (GAUZE/BANDAGES/DRESSINGS) ×15 IMPLANT
DRSG TEGADERM 4X4.75 (GAUZE/BANDAGES/DRESSINGS) ×3 IMPLANT
ELECT PENCIL ROCKER SW 15FT (MISCELLANEOUS) ×3 IMPLANT
ELECT REM PT RETURN 15FT ADLT (MISCELLANEOUS) ×3 IMPLANT
ENDOLOOP SUT PDS II  0 18 (SUTURE)
ENDOLOOP SUT PDS II 0 18 (SUTURE) IMPLANT
EVACUATOR SILICONE 100CC (DRAIN) ×3 IMPLANT
GAUZE SPONGE 2X2 8PLY STRL LF (GAUZE/BANDAGES/DRESSINGS) ×1 IMPLANT
GAUZE SPONGE 4X4 12PLY STRL (GAUZE/BANDAGES/DRESSINGS) IMPLANT
GLOVE BIO SURGEON STRL SZ 6.5 (GLOVE) ×2 IMPLANT
GLOVE BIO SURGEON STRL SZ7.5 (GLOVE) ×9 IMPLANT
GLOVE BIO SURGEONS STRL SZ 6.5 (GLOVE) ×2
GLOVE BIOGEL PI IND STRL 6.5 (GLOVE) IMPLANT
GLOVE BIOGEL PI IND STRL 7.0 (GLOVE) IMPLANT
GLOVE BIOGEL PI INDICATOR 6.5 (GLOVE) ×4
GLOVE BIOGEL PI INDICATOR 7.0 (GLOVE) ×8
GLOVE INDICATOR 8.0 STRL GRN (GLOVE) ×9 IMPLANT
GOWN SPEC L3 XXLG W/TWL (GOWN DISPOSABLE) ×4 IMPLANT
GOWN STRL REUS W/TWL XL LVL3 (GOWN DISPOSABLE) ×15 IMPLANT
GRASPER SUT TROCAR 14GX15 (MISCELLANEOUS) ×3 IMPLANT
HOLDER FOLEY CATH W/STRAP (MISCELLANEOUS) ×3 IMPLANT
IRRIG SUCT STRYKERFLOW 2 WTIP (MISCELLANEOUS) ×3
IRRIGATION SUCT STRKRFLW 2 WTP (MISCELLANEOUS) IMPLANT
KIT PROCEDURE DA VINCI SI (MISCELLANEOUS)
KIT PROCEDURE DVNC SI (MISCELLANEOUS) ×1 IMPLANT
LIGASURE IMPACT 36 18CM CVD LR (INSTRUMENTS) ×2 IMPLANT
MARKER SKIN DUAL TIP RULER LAB (MISCELLANEOUS) ×2 IMPLANT
NDL INSUFFLATION 14GA 120MM (NEEDLE) ×1 IMPLANT
NEEDLE INSUFFLATION 14GA 120MM (NEEDLE) ×3 IMPLANT
PACK CARDIOVASCULAR III (CUSTOM PROCEDURE TRAY) ×3 IMPLANT
PACK COLON (CUSTOM PROCEDURE TRAY) ×3 IMPLANT
PAD POSITIONING PINK XL (MISCELLANEOUS) ×3 IMPLANT
PORT LAP GEL ALEXIS MED 5-9CM (MISCELLANEOUS) ×3 IMPLANT
PROTECTOR NERVE ULNAR (MISCELLANEOUS) ×2 IMPLANT
RELOAD PROXIMATE 75MM BLUE (ENDOMECHANICALS) ×6 IMPLANT
RELOAD STAPLE 45 BLU REG DVNC (STAPLE) IMPLANT
RELOAD STAPLE 45 GRN THCK DVNC (STAPLE) IMPLANT
RELOAD STAPLE 75 3.8 BLU REG (ENDOMECHANICALS) IMPLANT
RETRACTOR WND ALEXIS 18 MED (MISCELLANEOUS) IMPLANT
RTRCTR WOUND ALEXIS 18CM MED (MISCELLANEOUS)
SCISSORS LAP 5X35 DISP (ENDOMECHANICALS) ×3 IMPLANT
SEAL CANN UNIV 5-8 DVNC XI (MISCELLANEOUS) ×4 IMPLANT
SEAL XI 5MM-8MM UNIVERSAL (MISCELLANEOUS) ×8
SEALER VESSEL DA VINCI XI (MISCELLANEOUS) ×2
SEALER VESSEL EXT DVNC XI (MISCELLANEOUS) ×1 IMPLANT
SLEEVE ADV FIXATION 5X100MM (TROCAR) IMPLANT
SOLUTION ELECTROLUBE (MISCELLANEOUS) ×3 IMPLANT
SPONGE GAUZE 2X2 STER 10/PKG (GAUZE/BANDAGES/DRESSINGS) ×2
SPONGE LAP 18X18 X RAY DECT (DISPOSABLE) ×2 IMPLANT
STAPLER 45 BLU RELOAD XI (STAPLE) IMPLANT
STAPLER 45 BLUE RELOAD XI (STAPLE)
STAPLER 45 GREEN RELOAD XI (STAPLE)
STAPLER 45 GRN RELOAD XI (STAPLE) IMPLANT
STAPLER CANNULA SEAL DVNC XI (STAPLE) ×1 IMPLANT
STAPLER CANNULA SEAL XI (STAPLE)
STAPLER GUN LINEAR PROX 60 (STAPLE) ×2 IMPLANT
STAPLER PROXIMATE 75MM BLUE (STAPLE) ×2 IMPLANT
STAPLER SHEATH (SHEATH)
STAPLER SHEATH ENDOWRIST DVNC (SHEATH) ×1 IMPLANT
SURGILUBE 2OZ TUBE FLIPTOP (MISCELLANEOUS) ×1 IMPLANT
SUT MNCRL AB 4-0 PS2 18 (SUTURE) ×5 IMPLANT
SUT PDS AB 1 CTX 36 (SUTURE) ×4 IMPLANT
SUT PDS AB 1 TP1 96 (SUTURE) IMPLANT
SUT PROLENE 0 CT 2 (SUTURE) IMPLANT
SUT PROLENE 2 0 KS (SUTURE) IMPLANT
SUT PROLENE 2 0 SH DA (SUTURE) IMPLANT
SUT SILK 2 0 (SUTURE) ×3
SUT SILK 2 0 SH CR/8 (SUTURE) ×2 IMPLANT
SUT SILK 2-0 18XBRD TIE 12 (SUTURE) IMPLANT
SUT SILK 3 0 (SUTURE)
SUT SILK 3 0 SH CR/8 (SUTURE) ×5 IMPLANT
SUT SILK 3-0 18XBRD TIE 12 (SUTURE) ×1 IMPLANT
SUT V-LOC BARB 180 2/0GR6 GS22 (SUTURE)
SUT VIC AB 3-0 SH 18 (SUTURE) IMPLANT
SUT VIC AB 3-0 SH 27 (SUTURE)
SUT VIC AB 3-0 SH 27XBRD (SUTURE) IMPLANT
SUT VICRYL 0 UR6 27IN ABS (SUTURE) ×1 IMPLANT
SUTURE V-LC BRB 180 2/0GR6GS22 (SUTURE) IMPLANT
SYR 10ML LL (SYRINGE) ×3 IMPLANT
SYS LAPSCP GELPORT 120MM (MISCELLANEOUS)
SYSTEM LAPSCP GELPORT 120MM (MISCELLANEOUS) IMPLANT
TAPE UMBILICAL COTTON 1/8X30 (MISCELLANEOUS) ×1 IMPLANT
TOWEL OR NON WOVEN STRL DISP B (DISPOSABLE) ×3 IMPLANT
TRAY FOLEY W/BAG SLVR 14FR (SET/KITS/TRAYS/PACK) ×2 IMPLANT
TROCAR ADV FIXATION 5X100MM (TROCAR) ×3 IMPLANT
TUBING CONNECTING 10 (TUBING) ×4 IMPLANT
TUBING CONNECTING 10' (TUBING) ×2
TUBING INSUFFLATION 10FT LAP (TUBING) ×3 IMPLANT

## 2018-05-15 NOTE — H&P (Signed)
H&P Update  H&P dated 04/22/18 was reviewed by myself with the patient. She reports no interval changes in her health or history. She took her prep and stool was clear. She is ready for her procedure.  NAD, comfortable RRR Abdomen soft, NT/ND  CEA 212 CT Chest completed 04/25/18 and showed no evidence of metastatic disease in the chest  Tara Baxter is a very pleasant 58yoF with no known past medical hx here today for newly diagnosed adenocarcinoma of the ascending colon -Tattoo just distal to mass per Dr. Fuller Plan -The anatomy and physiology of the GI tract was discussed at length with the patient again today. The pathophysiology of colon cancer was discussed at length as well. -We discussed minimally invasive approaches (robotic and laparoscopic) as well as open techniques. We discussed robotic as the initial plan. We discussed that I am early in my robotic practice at this particular hospital and a proctor will be present from [facility] in addition to my colorectal partner. -The planned procedure, material risks (including, but not limited to, pain, bleeding, infection, scarring, need for blood transfusion, damage to surrounding structures- blood vessels/nerves/viscus/organs, damage to ureter, urine leak, leak from anastomosis, need for additional procedures, need for stoma which may be permanent, hernia, recurrence of cancer, pneumonia, heart attack, stroke, death) benefits and alternatives to surgery were discussed at length. The patient's questions were answered to her satisfaction, she voiced understanding and elected to proceed with surgery. Additionally, we discussed typical postoperative expectations and the recovery process - we also discussed possibility of need for chemotherapy postoperatively based on pathology.  Tara Baxter, M.D. General and Colorectal Surgery [facility]

## 2018-05-15 NOTE — Discharge Instructions (Signed)
POST OP INSTRUCTIONS AFTER COLON SURGERY  1. DIET: Be sure to include lots of fluids daily to stay hydrated - 64oz of water per day (8, 8 oz glasses).  Avoid fast food or heavy meals for the first couple of weeks as your are more likely to get nauseated. Avoid raw/uncooked fruits or vegetables for the first 4 weeks (its ok to have these if they are blended into smoothie form). If you have fruits/vegetables, make sure they are cooked until soft enough to mash on the roof of your mouth and chew your food well. Otherwise, diet as tolerated.  2. Take your usually prescribed home medications unless otherwise directed.  3. PAIN CONTROL: a. Pain is best controlled by a usual combination of three different methods TOGETHER: i. Ice/Heat ii. Over the counter pain medication iii. Prescription pain medication b. Most patients will experience some swelling and bruising around the surgical site.  Ice packs or heating pads (30-60 minutes up to 6 times a day) will help. Some people prefer to use ice alone, heat alone, alternating between ice & heat.  Experiment to what works for you.  Swelling and bruising can take several weeks to resolve.   c. It is helpful to take an over-the-counter pain medication regularly for the first few weeks: i. Ibuprofen (Motrin/Advil) - 200mg  tabs - take 3 tabs (600mg ) every 6 hours as needed for pain (unless you have been directed previously to avoid NSAIDs/ibuprofen) ii. Acetaminophen (Tylenol) - you may take 650mg  every 6 hours as needed. You can take this with motrin as they act differently on the body. If you are taking a narcotic pain medication that has acetaminophen in it, do not take over the counter tylenol at the same time. iii. NOTE: You may take both of these medications together - most patients  find it most helpful when alternating between the two (i.e. Ibuprofen at 6am, tylenol at 9am, ibuprofen at 12pm ...) d. A  prescription for pain medication should be given to you  upon discharge.  Take your pain medication as prescribed if your pain is not adequatly controlled with the over-the-counter pain reliefs mentioned above.  4. Avoid getting constipated.  Between the surgery and the pain medications, it is common to experience some constipation.  Increasing fluid intake and taking a fiber supplement (such as Metamucil, Citrucel, FiberCon, MiraLax, etc) 1-2 times a day regularly will usually help prevent this problem from occurring.  A mild laxative (prune juice, Milk of Magnesia, MiraLax, etc) should be taken according to package directions if there are no bowel movements after 48 hours.    5. Dressing: Your incisions are covered in Dermabond which is like sterile superglue for the skin. This will come off on it's own in a couple weeks. If you were discharged with a "honecomb" dressing over the midline wound, you may remove this on postoperative day #3. All of your incisions are waterproof and you may bathe normally with soap/water over your incisions. Avoid baths/pools/lakes/oceans until your wounds have fully healed.  6. ACTIVITIES as tolerated:   a. Avoid heavy lifting (>10lbs or 1 gallon of milk) for the next 6 weeks. b. You may resume regular daily activities as tolerated--such as daily self-care, walking, climbing stairs--gradually increasing activities as tolerated.  If you can walk 30 minutes without difficulty, it is safe to try more intense activity such as jogging, treadmill, bicycling, low-impact aerobics.  c. DO NOT PUSH THROUGH PAIN.  Let pain be your guide: If it hurts to  do something, don't do it. d. Tara Baxter may drive when you are no longer taking prescription pain medication, you can comfortably wear a seatbelt, and you can safely maneuver your car and apply brakes. e. Tara Baxter may have sexual intercourse when it is comfortable.   7. FOLLOW UP in our office a. Please call [facility] at [phone] to set up an appointment to see your surgeon in the office for a  follow-up appointment approximately 2 weeks after your surgery. b. Make sure that you call for this appointment the day you arrive home to insure a convenient appointment time.  9. If you have disability or family leave forms that need to be completed, you may have them completed by your primary care physician's office; for return to work instructions, please ask our office staff and they will be happy to assist you in obtaining this documentation   When to call us [phone]: 1. Poor pain control 2. Reactions / problems with new medications (rash/itching, etc)  3. Fever over 101.5 F (38.5 C) 4. Inability to urinate 5. Nausea/vomiting 6. Worsening swelling or bruising 7. Continued bleeding from incision. 8. Increased pain, redness, or drainage from the incision  The clinic staff is available to answer your questions during regular business hours (8:30am-5pm).  Please dont hesitate to call and ask to speak to one of our nurses for clinical concerns.   A surgeon from [facility] is always on call at the hospitals   If you have a medical emergency, go to the nearest emergency room or call 911.  [facility], [place] [address], [place], [place], [place]  [address] MAIN: [phone] FAX: [phone] [email or web address]

## 2018-05-15 NOTE — Op Note (Signed)
PATIENT: Tara Baxter  52 y.o. female  Patient Care Team: Tawny Asal as PCP - General (Physician Assistant)  PREOP DIAGNOSIS: Ascending colon cancer  POSTOP DIAGNOSIS: Same  PROCEDURE: Xi Robotic right hemicolectomy  SURGEON: Sharon Mt. Dema Severin, MD  ASSISTANT and Proctor: Leighton Ruff, MD  Intuitive proctor present as well: Murvin Natal, MD  ANESTHESIA: General endotracheal  EBL: 100cc Total I/O In: 8299 [I.V.:1650; IV Piggyback:100] Out: 500 [Urine:400; Blood:100]  DRAINS: None  SPECIMEN: Terminal ileum and right colon  COUNTS: Sponge, needle and instrument counts were reported correct x2  FINDINGS:  Tattoo in proximal ascending colon. There were adhesions of peritoneum to colon but no tumor involving the abdominal wall or these adhesions - appeared to all be desmoplastic and filmy. Multiple nodes with tattoo extending into the root of the mesentery - the majority of these were removed but there were additional lymph nodes in the root of the mesentery which were not resectable - they were relatively soft but contained tattoo. This was near where the ileocolic artery entered into the SMA.  No obvious metastatic disease on visceral parietal peritoneum or liver.  STATEMENT OF MEDICAL NECESSITY: Ms. Nulty is a very pleasant 20yoF with no known past medical hx presented to the office today for evaluation of a newly diagnosed colon cancer in the ascending colon. She presented to the hospital 03/26/18 with vague crampy abdominal discomfort and pain with urination. She was found to have a urinary tract infection and treated for this. She had CT scan of the abdomen and pelvis which demonstrated wall thickening of the ascending colon concerning for colitis versus neoplasm. No findings on CT scan were noted for metastatic disease.   She was prescribed antibiotics for her UTI and discharged from the ER.She was seen in follow-up with GI and underwent a colonoscopy  with Dr. Fuller Plan on 04/16/18. This was notable for approximately four 6-8 mm polyps in the colon which were removed and benign. She was also found to have a mass in the ascending colon which demonstrated luminal patency but they could not advance the scope past it. This was biopsied and returned adenocarcinoma. She was referred to [place]. She has had an unintentional 20-30 weight loss over the last 6 months. CT Chest was completed and revealed no evidence of metastatic disease. Her CEA was quite elevated at 212. Options were discussed moving forward and she opted to undergo right hemicolectomy. Please refer to H&P for details regarding this discussion.  NARRATIVE:  The patient was identified & brought into the operating room, placed supine on the operating table and SCDs were applied to the lower extremities. General endotracheal anesthesia was induced. The patient was positioned supine with arms tucked. Antibiotics were administered. A foley catheter was placed under sterile conditions. Hair in the region of planned surgery was clipped by the nursing team. The abdomen was prepped and draped in a sterile fashion. A timeout was performed confirming our patient and plan.   An OG tube had been placed by anesthesia and was confirmed to be to suction. A Veress needle was placed at Palmer's point and pneumoperitoneum was established with CO2 to a maximum pressure of 15 mmHg. An 8 mm trocar was carefully placed in the left lower quadrant. The laparoscope was inserted and inspection revealed no evidence of trocar site complications. The Veress needle entry path was inspected and no evidence of injury was present, this was then removed. Three additional 8 mm trocars were then placed in an  oblique line along the abdomen under direct visualization. An additional 5 mm assist port was placed laterally. Bilateral transversus abdominus plane blocks were then created with a dilute mixture of Marcaine + Exparel. The patient was  positioned in trendelenburg with slight right side up. The robot was then docked.  The tattoo was identified in ascending colon and distal to this, a large mass. There were soft filmy attachments of the ascending colon to the peritoneum likely from prior inflammation in this region as noted on the CT. These were soft and did not at all appear like tumor spread. The liver and visceral surface of the bowel and stomach were normal in appearance. The peritoneum was also normal in appearance.  Beginning with a medial to lateral approach, the cecum was elevated anteriorly and the ileocolic pedicle was readily identified. The peritoneum over this was incised. The avascular plane between the ileocolic pedicle and the retroperitoneum was established and dissected. Working cephalad and medially the duodenum was identified and swept "down." The plane was developed further cephalad. Care was taken to stay in the plan anterior to the duodenum. Attention was then turned to ligation of the ileocolic pedicle. The pedicle was circumferentially dissected and then divided in a high fashion with the Vessel sealer. The pedicle was inspected and noted to be hemostatic.   Attention was turned to the lateral portion of the procedure. The retroperitoneal attachments of the terminal ileum were freed. The cecum and ascending colon was mobilized along the Phillippe Orlick line of Toldt up to the hepatic flexure. Following this, attention was turned to freeing the transverse colon. The transverse colon was retracted caudad. The omentum was grasped and elevated anteriorly. The omentum was incised and the lesser sac entered. This plane was developed laterally and the hepatic flexure taken down.  At this point, the cecum reached into the left upper quadrant and was sufficient for the extracorporeal portion of the procedure.  A supraumbilical incision was made and carried down to the midline fascia. This was then incised with electrocautery. The  peritoneum was identified and opened. A small Alexis wound protector with a cap and associated port was then placed and the field draped with sterile towels. The terminal ileum and right colon were delivered through the wound protector. The terminal ileum was then transected using a GIA blue load stapler. The remaining mesentery was divided using the Ligasure device. There were multiple enlarged lymph nodes deep in the ileocolic pedicle which were additionally included with the specimen - this went deep into the root of the mesentery and along the SMA - which were therefore not resectable and in a tenuous area to even consider for biopsy. The divided mesentery was inspected and noted to be hemostatic. The distal point of transection was then identified on the transverse colon at a location that included the right branch of the middle colics, leaving the main middle colic feeding the remaining transverse colon. This was transected using another blue load GIA stapler.  The specimen was then passed off. Attention was turned to creating the anastomosis. The terminal ileum and transverse colon were inspected for orientation to ensure no twisting nor bowel included in the mesenteric defect. An anastomosis was created between the terminal ileum and the transverse colon using a 75 mm GIA blue load stapler. The staple line was inspected and noted to be hemostatic.  The common enterotomy channel was closed using a TA 60 blue load stapler. Hemostasis was achieved at the staple line using 3-0 silk  U-stitches. 3-0 silk sutures were used to imbricate the corners of the staple line as well.  A 2-0 silk suture was placed securing the "crotch" of the anastomosis. The anastomosis was palpated and noted to be widely patent. This was then placed back into the abdomen and covered with omentum. Mesentery was reinspected and noted to be hemostatic. The omentum was then brought down over the anastomosis. The laparoscopic ports were removed  under direct visualization as she is quite thin and the sites noted to be hemostatic. The Alexis wound protector was removed, counts were reported correct, and we switched to clean instruments, gowns and drapes.   The fascia was then closed using two running #1 PDS sutures. The extraction incision was irrigated with sterile warm saline.  The skin of all incision sites was closed with 4-0 monocryl subcuticular suture. Dermabond was placed on the port sites and a sterile dressing was placed over the abdominal incision. All counts were reported correct x2. The patient was then awakened from anesthesia, extubated and transferred to a stretcher for transport to PACU in stable condition.   DISPOSITION: PACU in satisfactory condition

## 2018-05-15 NOTE — Transfer of Care (Signed)
Immediate Anesthesia Transfer of Care Note  Patient: Tara Baxter  Procedure(s) Performed: XI ROBOTIC [place] (Right Abdomen)  Patient Location: PACU  Anesthesia Type:General  Level of Consciousness: drowsy  Airway & Oxygen Therapy: Patient Spontanous Breathing and Patient connected to face mask oxygen  Post-op Assessment: Report given to RN and Post -op Vital signs reviewed and stable  Post vital signs: Reviewed and stable  Last Vitals:  Vitals Value Taken Time  BP 160/90 05/15/2018  3:01 PM  Temp    Pulse 77 05/15/2018  3:02 PM  Resp 16 05/15/2018  3:02 PM  SpO2 100 % 05/15/2018  3:02 PM  Vitals shown include unvalidated device data.  Last Pain:  Vitals:   05/15/18 0825  TempSrc: Oral      Patients Stated Pain Goal: 4 (07/61/91 5502)  Complications: No apparent anesthesia complications

## 2018-05-15 NOTE — Anesthesia Preprocedure Evaluation (Addendum)
Anesthesia Evaluation  Patient identified by MRN, date of birth, ID band Patient awake    Reviewed: Allergy & Precautions, NPO status , Patient's Chart, lab work & pertinent test results  Airway Mallampati: II  TM Distance: >3 FB Neck ROM: Full    Dental  (+) Missing, Poor Dentition, Chipped, Dental Advisory Given   Pulmonary Current Smoker,    Pulmonary exam normal breath sounds clear to auscultation       Cardiovascular negative cardio ROS Normal cardiovascular exam Rhythm:Regular Rate:Normal     Neuro/Psych negative neurological ROS  negative psych ROS   GI/Hepatic negative GI ROS, Neg liver ROS,   Endo/Other  negative endocrine ROS  Renal/GU negative Renal ROS  negative genitourinary   Musculoskeletal negative musculoskeletal ROS (+)   Abdominal   Peds negative pediatric ROS (+)  Hematology  (+) anemia ,   Anesthesia Other Findings   Reproductive/Obstetrics negative OB ROS                            Anesthesia Physical Anesthesia Plan  ASA: II  Anesthesia Plan: General   Post-op Pain Management:    Induction: Intravenous  PONV Risk Score and Plan: 3 and Ondansetron, Dexamethasone, Midazolam, Treatment may vary due to age or medical condition and Scopolamine patch - Pre-op  Airway Management Planned: Oral ETT  Additional Equipment:   Intra-op Plan:   Post-operative Plan: Extubation in OR  Informed Consent: I have reviewed the patients History and Physical, chart, labs and discussed the procedure including the risks, benefits and alternatives for the proposed anesthesia with the patient or authorized representative who has indicated his/her understanding and acceptance.   Dental advisory given  Plan Discussed with: CRNA and Surgeon  Anesthesia Plan Comments:         Anesthesia Quick Evaluation

## 2018-05-15 NOTE — Anesthesia Procedure Notes (Signed)
Procedure Name: Intubation Date/Time: 05/15/2018 11:21 AM Performed by: Lind Covert, CRNA Pre-anesthesia Checklist: Patient identified, Emergency Drugs available, Suction available, Patient being monitored and Timeout performed Patient Re-evaluated:Patient Re-evaluated prior to induction Oxygen Delivery Method: Circle system utilized Preoxygenation: Pre-oxygenation with 100% oxygen Induction Type: IV induction Ventilation: Mask ventilation without difficulty Laryngoscope Size: Mac and 4 Grade View: Grade I Tube type: Oral Tube size: 7.5 mm Number of attempts: 1 Airway Equipment and Method: Stylet Placement Confirmation: ETT inserted through vocal cords under direct vision,  positive ETCO2 and breath sounds checked- equal and bilateral Secured at: 22 cm Tube secured with: Tape Dental Injury: Teeth and Oropharynx as per pre-operative assessment

## 2018-05-16 DIAGNOSIS — D649 Anemia, unspecified: Secondary | ICD-10-CM | POA: Diagnosis present

## 2018-05-16 LAB — BASIC METABOLIC PANEL
Anion gap: 8 (ref 5–15)
BUN: 5 mg/dL — ABNORMAL LOW (ref 6–20)
CO2: 26 mmol/L (ref 22–32)
Calcium: 8.9 mg/dL (ref 8.9–10.3)
Chloride: 103 mmol/L (ref 98–111)
Creatinine, Ser: 0.57 mg/dL (ref 0.44–1.00)
GFR calc Af Amer: >60 mL/min (ref >60)
GLUCOSE: 109 mg/dL — AB (ref 70–99)
Potassium: 3.5 mmol/L (ref 3.5–5.1)
Sodium: 137 mmol/L (ref 135–145)

## 2018-05-16 LAB — MAGNESIUM: Magnesium: 1.8 mg/dL (ref 1.7–2.4)

## 2018-05-16 LAB — CBC
HCT: 30.3 % — ABNORMAL LOW (ref 36.0–46.0)
Hemoglobin: 9.5 g/dL — ABNORMAL LOW (ref 12.0–15.0)
MCH: 24.8 pg — AB (ref 26.0–34.0)
MCHC: 31.4 g/dL (ref 30.0–36.0)
MCV: 79.1 fL — ABNORMAL LOW (ref 80.0–100.0)
Platelets: 475 10*3/uL — ABNORMAL HIGH (ref 150–400)
RBC: 3.83 MIL/uL — ABNORMAL LOW (ref 3.87–5.11)
RDW: 18.6 % — ABNORMAL HIGH (ref 11.5–15.5)
WBC: 8.6 10*3/uL (ref 4.0–10.5)
nRBC: 0 % (ref 0.0–0.2)

## 2018-05-16 LAB — PHOSPHORUS: Phosphorus: 3.4 mg/dL (ref 2.5–4.6)

## 2018-05-16 MED ORDER — MAGIC MOUTHWASH
15.0000 mL | Freq: Four times a day (QID) | ORAL | Status: DC | PRN
  Filled 2018-05-16: qty 15

## 2018-05-16 MED ORDER — PROCHLORPERAZINE EDISYLATE 10 MG/2ML IJ SOLN: 5.0000 mg | INTRAMUSCULAR | Status: DC | PRN

## 2018-05-16 MED ORDER — LIP MEDEX EX OINT
1.0000 "application " | TOPICAL_OINTMENT | Freq: Two times a day (BID) | CUTANEOUS | Status: DC
  Administered 2018-05-16 – 2018-05-19 (×7): 1 via TOPICAL
  Filled 2018-05-16 (×2): qty 7

## 2018-05-16 MED ORDER — METOCLOPRAMIDE HCL 5 MG/ML IJ SOLN: 5.0000 mg | Freq: Three times a day (TID) | INTRAMUSCULAR | Status: DC | PRN

## 2018-05-16 MED ORDER — KCL IN DEXTROSE-NACL 20-5-0.9 MEQ/L-%-% IV SOLN
INTRAVENOUS | Status: DC
  Filled 2018-05-16: qty 1000

## 2018-05-16 MED ORDER — LACTATED RINGERS IV BOLUS: 1000.0000 mL | Freq: Three times a day (TID) | INTRAVENOUS | Status: DC | PRN

## 2018-05-16 NOTE — Progress Notes (Signed)
1 Day Post-Op   Subjective/Chief Complaint: Vomited overnight   Objective: Vital signs in last 24 hours: Temp:  [97.9 F (36.6 C)-99.7 F (37.6 C)] 98.7 F (37.1 C) (12/07 0927) Pulse Rate:  [56-82] 56 (12/07 0927) Resp:  [11-19] 18 (12/07 0927) BP: (130-165)/(69-91) 165/84 (12/07 0927) SpO2:  [98 %-100 %] 98 % (12/07 0927) Weight:  [68.8 kg] 68.8 kg (12/07 0500) Last BM Date: 05/15/18  Intake/Output from previous day: 12/06 0701 - 12/07 0700 In: 2631.7 [P.O.:300; I.V.:2231.7; IV Piggyback:100] Out: 1975 [Urine:1625; Emesis/NG output:250; Blood:100] Intake/Output this shift: No intake/output data recorded.  General appearance: alert and cooperative Resp: clear to auscultation bilaterally Cardio: regular rate and rhythm GI: soft, mild tenderness. quiet  Lab Results:  Recent Labs    05/16/18 0438  WBC 8.6  HGB 9.5*  HCT 30.3*  PLT 475*   BMET Recent Labs    05/16/18 0438  NA 137  K 3.5  CL 103  CO2 26  GLUCOSE 109*  BUN 5*  CREATININE 0.57  CALCIUM 8.9   PT/INR No results for input(s): LABPROT, INR in the last 72 hours. ABG No results for input(s): PHART, HCO3 in the last 72 hours.  Invalid input(s): PCO2, PO2  Studies/Results: No results found.  Anti-infectives: Anti-infectives (From admission, onward)   Start     Dose/Rate Route Frequency Ordered Stop   05/15/18 0845  cefoTEtan (CEFOTAN) 2 g in sodium chloride 0.9 % 100 mL IVPB     2 g 200 mL/hr over 30 Minutes Intravenous On call to O.R. 05/15/18 0835 05/15/18 1122      Assessment/Plan: s/p Procedure(s): XI ROBOTIC RIGHT HEMICOLECTOMY ERAS PATHWAY (Right) continue clears for now  Add reglan for ileus Ambulate Pod 1  LOS: 1 day    Autumn Messing III 05/16/2018

## 2018-05-17 LAB — BASIC METABOLIC PANEL
Anion gap: 8 (ref 5–15)
BUN: 6 mg/dL (ref 6–20)
CHLORIDE: 103 mmol/L (ref 98–111)
CO2: 26 mmol/L (ref 22–32)
Calcium: 8.8 mg/dL — ABNORMAL LOW (ref 8.9–10.3)
Creatinine, Ser: 0.54 mg/dL (ref 0.44–1.00)
GFR calc Af Amer: >60 mL/min (ref >60)
GFR calc non Af Amer: >60 mL/min (ref >60)
Glucose, Bld: 100 mg/dL — ABNORMAL HIGH (ref 70–99)
POTASSIUM: 3.7 mmol/L (ref 3.5–5.1)
SODIUM: 137 mmol/L (ref 135–145)

## 2018-05-17 LAB — CBC
HEMATOCRIT: 28.7 % — AB (ref 36.0–46.0)
HEMOGLOBIN: 8.8 g/dL — AB (ref 12.0–15.0)
MCH: 24.2 pg — ABNORMAL LOW (ref 26.0–34.0)
MCHC: 30.7 g/dL (ref 30.0–36.0)
MCV: 79.1 fL — ABNORMAL LOW (ref 80.0–100.0)
Platelets: 433 10*3/uL — ABNORMAL HIGH (ref 150–400)
RBC: 3.63 MIL/uL — ABNORMAL LOW (ref 3.87–5.11)
RDW: 18.5 % — AB (ref 11.5–15.5)
WBC: 7.3 10*3/uL (ref 4.0–10.5)
nRBC: 0 % (ref 0.0–0.2)

## 2018-05-17 LAB — PHOSPHORUS: Phosphorus: 3 mg/dL (ref 2.5–4.6)

## 2018-05-17 LAB — MAGNESIUM: Magnesium: 2.1 mg/dL (ref 1.7–2.4)

## 2018-05-17 NOTE — Progress Notes (Signed)
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  GOAL:   Patient will meet greater than or equal to 90% of their needs  MONITOR:   PO intake, Supplement acceptance, Labs, Weight trends, I & O's  REASON FOR ASSESSMENT:   Malnutrition Screening Tool   ASSESSMENT:    52yoF with no known past medical hx here today for newly diagnosed adenocarcinoma of the ascending colon 12/6: s/p Xi Robotic right hemicolectomy  Patient reports poor appetite since August 2019. Pt states she still isn't eating well, completing ~10-25% of meals. Pt states she has no tried the Ensure Surgery supplements. Asks if these are necessary. Encouraged drinking protein shakes which do not have to be Ensure specific. Reviewed options she can try at home.  Per patient UBW is 160-165 lb. Pt has lost 9 lb since 11/7 (6% wt loss x 1 month, significant for time frame).   Medications: Florastor capsule BID, IV Zofran PRN Labs reviewed: Mg/Phos WNL   NUTRITION - FOCUSED PHYSICAL EXAM:    Most Recent Value  Orbital Region  No depletion  Upper Arm Region  No depletion  Thoracic and Lumbar Region  Unable to assess  Buccal Region  Mild depletion  Temple Region  Mild depletion  Clavicle Bone Region  Mild depletion  Clavicle and Acromion Bone Region  Mild depletion  Scapular Bone Region  Unable to assess  Dorsal Hand  No depletion  Patellar Region  Unable to assess  Anterior Thigh Region  Unable to assess  Posterior Calf Region  Unable to assess  Edema (RD Assessment)  None       Diet Order:   Diet Order            Diet  full liquid Room service appropriate? Yes; Fluid consistency: Thin  Diet effective now              EDUCATION NEEDS:   Education needs have been addressed  Skin:  Skin Assessment: Reviewed RN Assessment  Last BM:  12/7  Height:   Ht Readings from Last 1 Encounters:  05/15/18 5' 8.5" (1.74 m)    Weight:   Wt Readings from Last 1 Encounters:  05/17/18 66.2 kg    Ideal Body Weight:  63.6 kg  BMI:  Body mass index is 21.87 kg/m.  Estimated Nutritional Needs:   Kcal:  1700-1900  Protein:  80-90g  Fluid:  1.9L/day   Clayton Bibles, MS, RD, LDN [place] [facility] Pager: [phone] After Hours Pager: [phone]

## 2018-05-17 NOTE — Progress Notes (Signed)
2 Days Post-Op   Subjective/Chief Complaint: Feels better. No vomiting since yesterday morning   Objective: Vital signs in last 24 hours: Temp:  [98.8 F (37.1 C)-99.8 F (37.7 C)] 98.9 F (37.2 C) (12/08 0522) Pulse Rate:  [61-69] 69 (12/08 0522) Resp:  [16-18] 16 (12/08 0522) BP: (144-161)/(69-76) 157/69 (12/08 0522) SpO2:  [98 %-100 %] 98 % (12/08 0522) Weight:  [66.2 kg] 66.2 kg (12/08 0500) Last BM Date: 05/16/18  Intake/Output from previous day: 12/07 0701 - 12/08 0700 In: 1931.6 [P.O.:390; I.V.:1541.6] Out: 975 [Urine:775; Emesis/NG output:200] Intake/Output this shift: Total I/O In: 60 [P.O.:60] Out: 300 [Urine:300]  General appearance: alert and cooperative Resp: clear to auscultation bilaterally Cardio: regular rate and rhythm GI: soft, mild tenderness  Lab Results:  Recent Labs    05/16/18 0438 05/17/18 0417  WBC 8.6 7.3  HGB 9.5* 8.8*  HCT 30.3* 28.7*  PLT 475* 433*   BMET Recent Labs    05/16/18 0438 05/17/18 0417  NA 137 137  K 3.5 3.7  CL 103 103  CO2 26 26  GLUCOSE 109* 100*  BUN 5* 6  CREATININE 0.57 0.54  CALCIUM 8.9 8.8*   PT/INR No results for input(s): LABPROT, INR in the last 72 hours. ABG No results for input(s): PHART, HCO3 in the last 72 hours.  Invalid input(s): PCO2, PO2  Studies/Results: No results found.  Anti-infectives: Anti-infectives (From admission, onward)   Start     Dose/Rate Route Frequency Ordered Stop   05/15/18 0845  cefoTEtan (CEFOTAN) 2 g in sodium chloride 0.9 % 100 mL IVPB     2 g 200 mL/hr over 30 Minutes Intravenous On call to O.R. 05/15/18 0835 05/15/18 1122      Assessment/Plan: s/p Procedure(s): XI ROBOTIC RIGHT HEMICOLECTOMY ERAS PATHWAY (Right) Advance diet. Continue fulls today Ambulate Pod 2  LOS: 2 days    Tara Baxter 05/17/2018

## 2018-05-17 NOTE — Anesthesia Postprocedure Evaluation (Signed)
Anesthesia Post Note  Patient: Tara Baxter  Procedure(s) Performed: XI ROBOTIC RIGHT HEMICOLECTOMY ERAS PATHWAY (Right Abdomen)     Patient location during evaluation: PACU Anesthesia Type: General Level of consciousness: awake and alert Pain management: pain level controlled Vital Signs Assessment: post-procedure vital signs reviewed and stable Respiratory status: spontaneous breathing, nonlabored ventilation, respiratory function stable and patient connected to nasal cannula oxygen Cardiovascular status: blood pressure returned to baseline and stable Postop Assessment: no apparent nausea or vomiting Anesthetic complications: no    Last Vitals:  Vitals:   05/16/18 2137 05/17/18 0522  BP: (!) 144/76 (!) 157/69  Pulse: 62 69  Resp: 18 16  Temp: 37.1 C 37.2 C  SpO2: 99% 98%    Last Pain:  Vitals:   05/17/18 0522  TempSrc: Oral  PainSc:                  Detria Cummings S

## 2018-05-18 DIAGNOSIS — E440 Moderate protein-calorie malnutrition: Secondary | ICD-10-CM

## 2018-05-18 LAB — BASIC METABOLIC PANEL
Anion gap: 8 (ref 5–15)
BUN: 5 mg/dL — ABNORMAL LOW (ref 6–20)
CHLORIDE: 104 mmol/L (ref 98–111)
CO2: 26 mmol/L (ref 22–32)
CREATININE: 0.46 mg/dL (ref 0.44–1.00)
Calcium: 8.8 mg/dL — ABNORMAL LOW (ref 8.9–10.3)
GFR calc Af Amer: >60 mL/min (ref >60)
GFR calc non Af Amer: >60 mL/min (ref >60)
Glucose, Bld: 99 mg/dL (ref 70–99)
Potassium: 3.8 mmol/L (ref 3.5–5.1)
SODIUM: 138 mmol/L (ref 135–145)

## 2018-05-18 LAB — TYPE AND SCREEN
ABO/RH(D): B POS
Antibody Screen: NEGATIVE

## 2018-05-18 LAB — CBC
HCT: 30.8 % — ABNORMAL LOW (ref 36.0–46.0)
Hemoglobin: 9.4 g/dL — ABNORMAL LOW (ref 12.0–15.0)
MCH: 24.7 pg — ABNORMAL LOW (ref 26.0–34.0)
MCHC: 30.5 g/dL (ref 30.0–36.0)
MCV: 81.1 fL (ref 80.0–100.0)
Platelets: 441 10*3/uL — ABNORMAL HIGH (ref 150–400)
RBC: 3.8 MIL/uL — ABNORMAL LOW (ref 3.87–5.11)
RDW: 18.7 % — ABNORMAL HIGH (ref 11.5–15.5)
WBC: 5.5 10*3/uL (ref 4.0–10.5)
nRBC: 0 % (ref 0.0–0.2)

## 2018-05-18 LAB — PHOSPHORUS: Phosphorus: 3.1 mg/dL (ref 2.5–4.6)

## 2018-05-18 LAB — MAGNESIUM: Magnesium: 2.1 mg/dL (ref 1.7–2.4)

## 2018-05-18 MED ORDER — METOCLOPRAMIDE HCL 5 MG/ML IJ SOLN: 5.0000 mg | Freq: Three times a day (TID) | INTRAMUSCULAR | Status: AC | PRN

## 2018-05-18 NOTE — Progress Notes (Signed)
Subjective No acute events. Feeling well. Tolerated full liquids without n/v. Denies flatus; smear of stool on Saturday. Ambulating around unit. Denies abdominal pain.  Objective: Vital signs in last 24 hours: Temp:  [98.7 F (37.1 C)-98.9 F (37.2 C)] 98.9 F (37.2 C) (12/09 0622) Pulse Rate:  [75-79] 76 (12/09 0622) Resp:  [18] 18 (12/09 0622) BP: (139-162)/(69-75) 139/69 (12/09 0622) SpO2:  [100 %] 100 % (12/09 0622) Last BM Date: 05/16/18  Intake/Output from previous day: 12/08 0701 - 12/09 0700 In: 1005.1 [P.O.:840; I.V.:165.1] Out: 7121 [Urine:1675] Intake/Output this shift: No intake/output data recorded.  Gen: NAD, comfortable CV: RRR Pulm: Normal work of breathing Abd: Soft, nontender, nondistended; no rebound; no guarding; incisions c/d/i without erythema. Ext: SCDs in place  Lab Results: CBC  Recent Labs    05/17/18 0417 05/18/18 0408  WBC 7.3 5.5  HGB 8.8* 9.4*  HCT 28.7* 30.8*  PLT 433* 441*   BMET Recent Labs    05/17/18 0417 05/18/18 0408  NA 137 138  K 3.7 3.8  CL 103 104  CO2 26 26  GLUCOSE 100* 99  BUN 6 5*  CREATININE 0.54 0.46  CALCIUM 8.8* 8.8*   PT/INR No results for input(s): LABPROT, INR in the last 72 hours. ABG No results for input(s): PHART, HCO3 in the last 72 hours.  Invalid input(s): PCO2, PO2  Studies/Results:  Anti-infectives: Anti-infectives (From admission, onward)   Start     Dose/Rate Route Frequency Ordered Stop   05/15/18 0845  cefoTEtan (CEFOTAN) 2 g in sodium chloride 0.9 % 100 mL IVPB     2 g 200 mL/hr over 30 Minutes Intravenous On call to O.R. 05/15/18 0835 05/15/18 1122       Assessment/Plan: Patient Active Problem List   Diagnosis Date Noted  . Malnutrition of moderate degree 05/18/2018  . Anemia   . Cancer of ascending colon s/p robotic colectomy 05/15/2018 05/15/2018  . Abnormal CT scan, colon 03/27/2018   s/p Procedure(s): XI ROBOTIC RIGHT HEMICOLECTOMY ERAS PATHWAY 05/15/2018  -Advance to  GI soft diet -D/C IVF -Ambulate 5x/day -Continue entereg until having reliable bowel fxn -PPx: SQH, SCDs -Dispo: Pending toleration of diet, return of bowel function   LOS: 3 days   Sharon Mt. Dema Severin, M.D. General and Colorectal Surgery [facility]

## 2018-05-18 NOTE — Progress Notes (Signed)
Entereg DC'ed per protocol  Pt had BM

## 2018-05-19 LAB — BASIC METABOLIC PANEL
Anion gap: 9 (ref 5–15)
BUN: 8 mg/dL (ref 6–20)
CO2: 28 mmol/L (ref 22–32)
CREATININE: 0.51 mg/dL (ref 0.44–1.00)
Calcium: 9.2 mg/dL (ref 8.9–10.3)
Chloride: 102 mmol/L (ref 98–111)
GFR calc Af Amer: >60 mL/min (ref >60)
GFR calc non Af Amer: >60 mL/min (ref >60)
Glucose, Bld: 102 mg/dL — ABNORMAL HIGH (ref 70–99)
Potassium: 3.7 mmol/L (ref 3.5–5.1)
Sodium: 139 mmol/L (ref 135–145)

## 2018-05-19 LAB — PHOSPHORUS: PHOSPHORUS: 4 mg/dL (ref 2.5–4.6)

## 2018-05-19 LAB — MAGNESIUM: Magnesium: 2 mg/dL (ref 1.7–2.4)

## 2018-05-19 NOTE — Discharge Summary (Addendum)
Patient ID: Tara Baxter MRN: [id number] DOB/AGE: May 27, 1966 52 y.o.  Admit date: 05/15/2018 Discharge date: 05/19/2018  Discharge Diagnoses Patient Active Problem List   Diagnosis Date Noted  . Malnutrition of moderate degree 05/18/2018  . Anemia   . Cancer of ascending colon s/p robotic colectomy 05/15/2018 05/15/2018  . Abnormal CT scan, colon 03/27/2018    Consultants None  Procedures Xi Robotic Right hemicolectomy 05/15/18  Hospital Course: She was admitted to the hospital postoperatively and did well. Her pathology returned adenocarcinoma of the ascending colon with 0/20 Lymph nodes positive for cancer. Her pathology was reviewed and discussed with her prior to discharge. On 05/18/18 she began passing gas and had a BM. On 05/19/18, she was tolerating a diet, pain was well controlled on oral analgesics, she was ambulating freely on her own and having bowel function. She was deemed stable for discharge home.  Vitals:   05/18/18 0622 05/18/18 1346 05/18/18 2134 05/19/18 0557  BP: 139/69 (!) 156/78 131/77 (!) 151/84  Pulse: 76 67 77 75  Resp: 18 17 14 14   Temp: 98.9 F (37.2 C) 98.7 F (37.1 C) 98.4 F (36.9 C) 98.9 F (37.2 C)  TempSrc: Oral Oral Oral Oral  SpO2: 100% 100% 97% 99%  Weight:    64.6 kg  Height:       NAD, comfortable RRR Abdomen soft, NT/ND; incisions c/d/i without erythema Ext: No pitting edema  Allergies as of 05/19/2018   No Known Allergies     Medication List    TAKE these medications   ondansetron 8 MG disintegrating tablet Commonly known as:  ZOFRAN-ODT Take 1 tablet (8 mg total) by mouth every 8 (eight) hours as needed for nausea or vomiting.   potassium chloride SA 20 MEQ tablet Commonly known as:  K-DUR,KLOR-CON Take 1 tablet (20 mEq total) by mouth 2 (two) times daily for 5 days.   traMADol 50 MG tablet Commonly known as:  ULTRAM Take 1 tablet (50 mg total) by mouth every 6 (six) hours as needed for up to 7 days (Postop pain not  controlled with ibuprofen/tylenol).        Follow-up Information    Ileana Roup, MD Follow up in 3 week(s).   Specialty:  General Surgery Contact information: [place] [address] [phone]           Oveda Dadamo M. Dema Severin, M.D. [facility]

## 2018-05-19 NOTE — Discharge Planning (Signed)
Patient IV removed.  RN assessment and VS revealed stability for DC to home.  Discharge papers given, explained and educated on home/incsion care, s/sx of infection and when to call the Dr.  Informed of suggested FU appt and appt made. Pain script sent to pharm. Once ready will be wheeled to front and family transporting home via car. Waiting on arrival.

## 2019-03-11 ENCOUNTER — Encounter: Payer: Self-pay | Admitting: Gastroenterology

## 2019-03-17 ENCOUNTER — Other Ambulatory Visit: Payer: Self-pay | Admitting: Surgery

## 2019-03-17 DIAGNOSIS — C182 Malignant neoplasm of ascending colon: Secondary | ICD-10-CM

## 2019-03-31 ENCOUNTER — Encounter: Payer: Self-pay | Admitting: Gastroenterology

## 2019-03-31 ENCOUNTER — Ambulatory Visit (AMBULATORY_SURGERY_CENTER): Payer: BC Managed Care – PPO | Admitting: *Deleted

## 2019-03-31 ENCOUNTER — Other Ambulatory Visit: Payer: Self-pay

## 2019-03-31 VITALS — Temp 97.3°F | Ht 68.5 in | Wt 172.0 lb

## 2019-03-31 DIAGNOSIS — Z1159 Encounter for screening for other viral diseases: Secondary | ICD-10-CM

## 2019-03-31 DIAGNOSIS — Z86010 Personal history of colonic polyps: Secondary | ICD-10-CM

## 2019-03-31 DIAGNOSIS — Z85038 Personal history of other malignant neoplasm of large intestine: Secondary | ICD-10-CM

## 2019-03-31 MED ORDER — NA SULFATE-K SULFATE-MG SULF 17.5-3.13-1.6 GM/177ML PO SOLN: ORAL | 0 refills | Status: DC

## 2019-03-31 NOTE — Progress Notes (Signed)
Patient is here in-person for PV. Patient denies any allergies to eggs or soy. Patient denies any problems with anesthesia/sedation. Patient denies any oxygen use at home. Patient denies taking any diet/weight loss medications or blood thinners. Patient is not being treated for MRSA or C-diff. EMMI education assisgned to patient on colonoscopy, this was explained and instructions given to patient. Suprep $15 off coupon given to pt.   Pt is aware that care partner will wait in the car during procedure; if they feel like they will be too hot or cold to wait in the car; they may wait in the 4 th floor lobby. Patient is aware to bring only one care partner. We want them to wear a mask (we do not have any that we can provide them), practice social distancing, and we will check their temperatures when they get here.  I did remind the patient that their care partner needs to stay in the parking lot the entire time and have a cell phone available, we will call them when the pt is ready for discharge. Patient will wear mask into building.  Covid test on 10/30-pt is aware!

## 2019-04-09 ENCOUNTER — Other Ambulatory Visit: Payer: Self-pay | Admitting: Gastroenterology

## 2019-04-09 LAB — SARS CORONAVIRUS 2 (TAT 6-24 HRS): SARS Coronavirus 2: NEGATIVE

## 2019-04-14 ENCOUNTER — Other Ambulatory Visit: Payer: Self-pay

## 2019-04-14 ENCOUNTER — Encounter: Payer: Self-pay | Admitting: Gastroenterology

## 2019-04-14 ENCOUNTER — Ambulatory Visit (AMBULATORY_SURGERY_CENTER): Payer: BC Managed Care – PPO | Admitting: Gastroenterology

## 2019-04-14 VITALS — BP 130/58 | HR 57 | Temp 98.5°F | Resp 15 | Ht 68.0 in | Wt 172.0 lb

## 2019-04-14 DIAGNOSIS — Z85038 Personal history of other malignant neoplasm of large intestine: Secondary | ICD-10-CM

## 2019-04-14 DIAGNOSIS — R933 Abnormal findings on diagnostic imaging of other parts of digestive tract: Secondary | ICD-10-CM | POA: Diagnosis not present

## 2019-04-14 DIAGNOSIS — D125 Benign neoplasm of sigmoid colon: Secondary | ICD-10-CM

## 2019-04-14 DIAGNOSIS — K635 Polyp of colon: Secondary | ICD-10-CM

## 2019-04-14 MED ORDER — SODIUM CHLORIDE 0.9 % IV SOLN: 500.0000 mL | INTRAVENOUS | Status: DC

## 2019-04-14 NOTE — Progress Notes (Signed)
Called to room to assist during endoscopic procedure.  Patient ID and intended procedure confirmed with present staff. Received instructions for my participation in the procedure from the performing physician.  

## 2019-04-14 NOTE — Op Note (Addendum)
[place] Patient Name: Tara Baxter Procedure Date: 04/14/2019 3:05 PM MRN: [id number] Endoscopist: Ladene Artist , MD Age: 53 Referring MD:  Date of Birth: 1966/01/20 Gender: Female Account #: [id number] Procedure:                Colonoscopy Indications:              High risk colon cancer surveillance: Personal                            history of colon cancer. Abnormal CT scan (colon) Medicines:                Monitored Anesthesia Care Procedure:                Pre-Anesthesia Assessment:                           - Prior to the procedure, a History and Physical                            was performed, and patient medications and                            allergies were reviewed. The patient's tolerance of                            previous anesthesia was also reviewed. The risks                            and benefits of the procedure and the sedation                            options and risks were discussed with the patient.                            All questions were answered, and informed consent                            was obtained. Prior Anticoagulants: The patient has                            taken no previous anticoagulant or antiplatelet                            agents. ASA Grade Assessment: II - A patient with                            mild systemic disease. After reviewing the risks                            and benefits, the patient was deemed in                            satisfactory condition to undergo the procedure.  After obtaining informed consent, the colonoscope                            was passed under direct vision. Throughout the                            procedure, the patient's blood pressure, pulse, and                            oxygen saturations were monitored continuously. The                            Colonoscope was introduced through the anus and                            advanced to the  the ileocolonic anastomosis. The                            rectum was photographed. The quality of the bowel                            preparation was good. The colonoscopy was performed                            without difficulty. The patient tolerated the                            procedure well. Scope In: 3:15:24 PM Scope Out: 3:30:40 PM Scope Withdrawal Time: 0 hours 13 minutes 24 seconds  Total Procedure Duration: 0 hours 15 minutes 16 seconds  Findings:                 The perianal and digital rectal examinations were                            normal.                           There was evidence of a prior end-to-side                            ileo-colonic anastomosis in the ascending colon.                            This was patent and was characterized by healthy                            appearing mucosa. The anastomosis was traversed.                           The neo-terminal ileum appeared normal.                           Four sessile polyps were found in the sigmoid  colon. The polyps were 5 to 6 mm in size. These                            polyps were removed with a cold snare. Resection                            and retrieval were complete.                           The exam was otherwise without abnormality on                            direct and retroflexion views. Complications:            No immediate complications. Estimated blood loss:                            None. Estimated Blood Loss:     Estimated blood loss: none. Impression:               - Patent end-to-side ileo-colonic anastomosis,                            characterized by healthy appearing mucosa.                           - The examined portion of the ileum was normal.                           - Four 5 to 6 mm polyps in the sigmoid colon,                            removed with a cold snare. Resected and retrieved.                           - The examination was  otherwise normal on direct                            and retroflexion views. Recommendation:           - Repeat colonoscopy in 2 years for surveillance                            pending pathology review.                           - Patient has a contact number available for                            emergencies. The signs and symptoms of potential                            delayed complications were discussed with the                            patient.  Return to normal activities tomorrow.                            Written discharge instructions were provided to the                            patient.                           - Resume previous diet.                           - Continue present medications.                           - Await pathology results. Ladene Artist, MD 04/14/2019 3:42:53 PM This report has been signed electronically.

## 2019-04-14 NOTE — Progress Notes (Signed)
Temp JB  V/S KA I have reviewed the patient's medical history in detail and updated the computerized patient record.

## 2019-04-14 NOTE — Progress Notes (Signed)
PT taken to PACU. Monitors in place. VSS. Report given to RN. 

## 2019-04-14 NOTE — Patient Instructions (Signed)
Information on polyps given to you today.  Await pathology results.   YOU HAD AN ENDOSCOPIC PROCEDURE TODAY AT THE [facility]:   Refer to the procedure report that was given to you for any specific questions about what was found during the examination.  If the procedure report does not answer your questions, please call your gastroenterologist to clarify.  If you requested that your care partner not be given the details of your procedure findings, then the procedure report has been included in a sealed envelope for you to review at your convenience later.  YOU SHOULD EXPECT: Some feelings of bloating in the abdomen. Passage of more gas than usual.  Walking can help get rid of the air that was put into your GI tract during the procedure and reduce the bloating. If you had a lower endoscopy (such as a colonoscopy or flexible sigmoidoscopy) you may notice spotting of blood in your stool or on the toilet paper. If you underwent a bowel prep for your procedure, you may not have a normal bowel movement for a few days.  Please Note:  You might notice some irritation and congestion in your nose or some drainage.  This is from the oxygen used during your procedure.  There is no need for concern and it should clear up in a day or so.  SYMPTOMS TO REPORT IMMEDIATELY:   Following lower endoscopy (colonoscopy or flexible sigmoidoscopy):  Excessive amounts of blood in the stool  Significant tenderness or worsening of abdominal pains  Swelling of the abdomen that is new, acute  Fever of 100F or higher   For urgent or emergent issues, a gastroenterologist can be reached at any hour by calling [phone].   DIET:  We do recommend a small meal at first, but then you may proceed to your regular diet.  Drink plenty of fluids but you should avoid alcoholic beverages for 24 hours.  ACTIVITY:  You should plan to take it easy for the rest of today and you should NOT DRIVE or use heavy machinery  until tomorrow (because of the sedation medicines used during the test).    FOLLOW UP: Our staff will call the number listed on your records 48-72 hours following your procedure to check on you and address any questions or concerns that you may have regarding the information given to you following your procedure. If we do not reach you, we will leave a message.  We will attempt to reach you two times.  During this call, we will ask if you have developed any symptoms of COVID 19. If you develop any symptoms (ie: fever, flu-like symptoms, shortness of breath, cough etc.) before then, please call [phone].  If you test positive for Covid 19 in the 2 weeks post procedure, please call and report this information to us.    If any biopsies were taken you will be contacted by phone or by letter within the next 1-3 weeks.  Please call us at [phone] if you have not heard about the biopsies in 3 weeks.    SIGNATURES/CONFIDENTIALITY: You and/or your care partner have signed paperwork which will be entered into your electronic medical record.  These signatures attest to the fact that that the information above on your After Visit Summary has been reviewed and is understood.  Full responsibility of the confidentiality of this discharge information lies with you and/or your care-partner. 

## 2019-04-16 ENCOUNTER — Telehealth: Payer: Self-pay

## 2019-04-16 NOTE — Telephone Encounter (Signed)
Called [phone] and left a messaged we tried to reach pt for a follow up call. maw

## 2019-04-16 NOTE — Telephone Encounter (Signed)
Patient is doing good after procedure. Had no problems or questions.

## 2019-04-16 NOTE — Telephone Encounter (Signed)
  Follow up Call-  Call back number 04/14/2019 04/16/2018  Post procedure Call Back phone  # [phone]  Permission to leave phone message Yes Yes  Some recent data might be hidden     Patient questions:  Do you have a fever, pain , or abdominal swelling? No. Pain Score  0 *  Have you tolerated food without any problems? Yes.    Have you been able to return to your normal activities? Yes.    Do you have any questions about your discharge instructions: Diet   No. Medications  No. Follow up visit  No.  Do you have questions or concerns about your Care? No.  Actions: * If pain score is 4 or above: No action needed, pain <4.   1. Have you developed a fever since your procedure? No  2.   Have you had an respiratory symptoms (SOB or cough) since your procedure? No  3.   Have you tested positive for COVID 19 since your procedure No  4.   Have you had any family members/close contacts diagnosed with the COVID 19 since your procedure?  No   If yes to any of these questions please route to Joylene John, RN and Alphonsa Gin, RN.

## 2019-04-22 ENCOUNTER — Ambulatory Visit
Admission: RE | Admit: 2019-04-22 | Discharge: 2019-04-22 | Disposition: A | Discharge status: Home / Self Care | Payer: BC Managed Care – PPO | Source: Ambulatory Visit | Attending: Surgery | Admitting: Surgery

## 2019-04-22 DIAGNOSIS — C182 Malignant neoplasm of ascending colon: Secondary | ICD-10-CM

## 2019-04-22 MED ORDER — IOPAMIDOL (ISOVUE-300) INJECTION 61%
100.0000 mL | Freq: Once | INTRAVENOUS | Status: AC | PRN
  Administered 2019-04-22: 100 mL via INTRAVENOUS

## 2019-04-23 ENCOUNTER — Encounter: Payer: Self-pay | Admitting: Gastroenterology

## 2019-09-30 ENCOUNTER — Other Ambulatory Visit: Payer: Self-pay | Admitting: Surgery

## 2019-09-30 DIAGNOSIS — Z1231 Encounter for screening mammogram for malignant neoplasm of breast: Secondary | ICD-10-CM

## 2019-10-04 ENCOUNTER — Ambulatory Visit
Admission: RE | Admit: 2019-10-04 | Discharge: 2019-10-04 | Disposition: A | Discharge status: Home / Self Care | Payer: BC Managed Care – PPO | Source: Ambulatory Visit | Attending: Surgery | Admitting: Surgery

## 2019-10-04 ENCOUNTER — Other Ambulatory Visit: Payer: Self-pay

## 2019-10-04 DIAGNOSIS — Z1231 Encounter for screening mammogram for malignant neoplasm of breast: Secondary | ICD-10-CM

## 2020-04-04 ENCOUNTER — Other Ambulatory Visit: Payer: Self-pay | Admitting: Surgery

## 2020-04-04 DIAGNOSIS — C182 Malignant neoplasm of ascending colon: Secondary | ICD-10-CM

## 2020-04-20 ENCOUNTER — Ambulatory Visit
Admission: RE | Admit: 2020-04-20 | Discharge: 2020-04-20 | Disposition: A | Discharge status: Home / Self Care | Payer: BC Managed Care – PPO | Source: Ambulatory Visit | Attending: Surgery | Admitting: Surgery

## 2020-04-20 DIAGNOSIS — C182 Malignant neoplasm of ascending colon: Secondary | ICD-10-CM

## 2020-04-20 MED ORDER — IOPAMIDOL (ISOVUE-300) INJECTION 61%
100.0000 mL | Freq: Once | INTRAVENOUS | Status: AC | PRN
  Administered 2020-04-20: 100 mL via INTRAVENOUS

## 2021-07-26 IMAGING — MG DIGITAL SCREENING BILAT W/ TOMO W/ CAD
8 series · 8 of 24 positions shown · non-contrast
Comparison: Previous exam(s).

CLINICAL DATA: Screening.

EXAM:
DIGITAL SCREENING BILATERAL MAMMOGRAM WITH TOMO AND CAD

[L CC synth-2D]
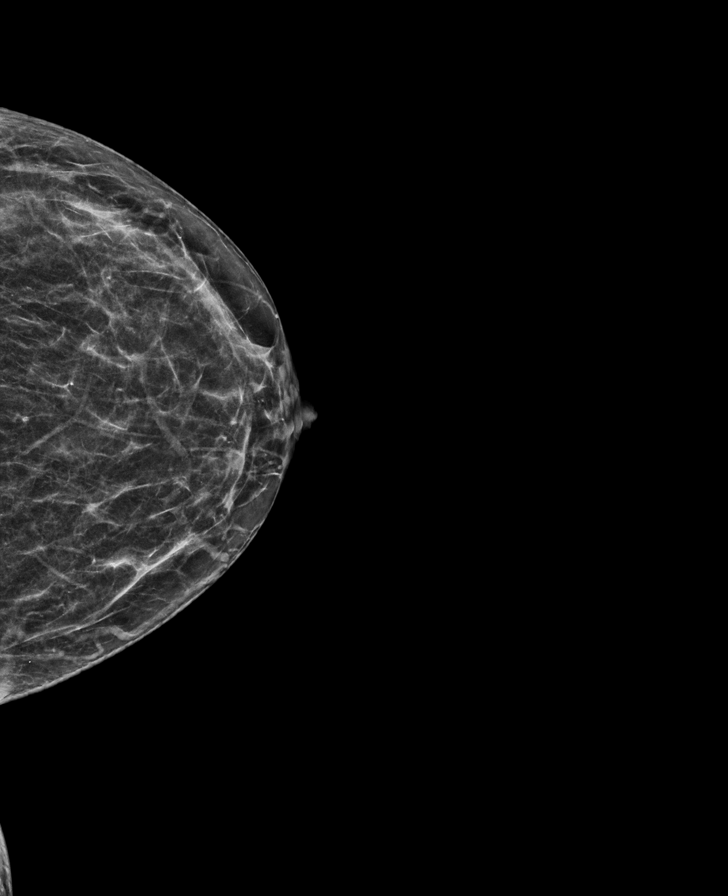

[R CC synth-2D]
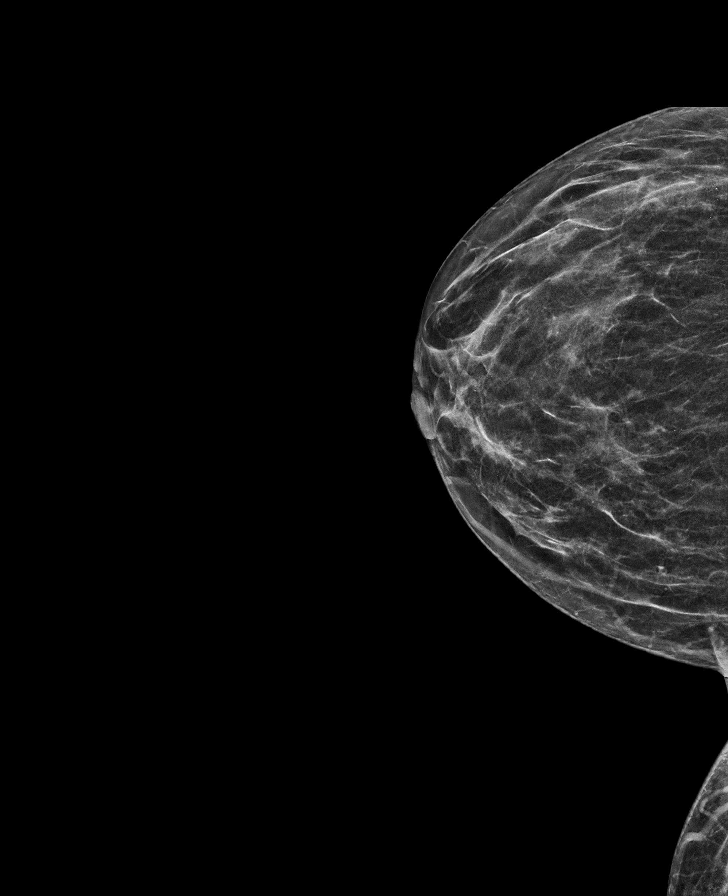

[R MLO synth-2D]
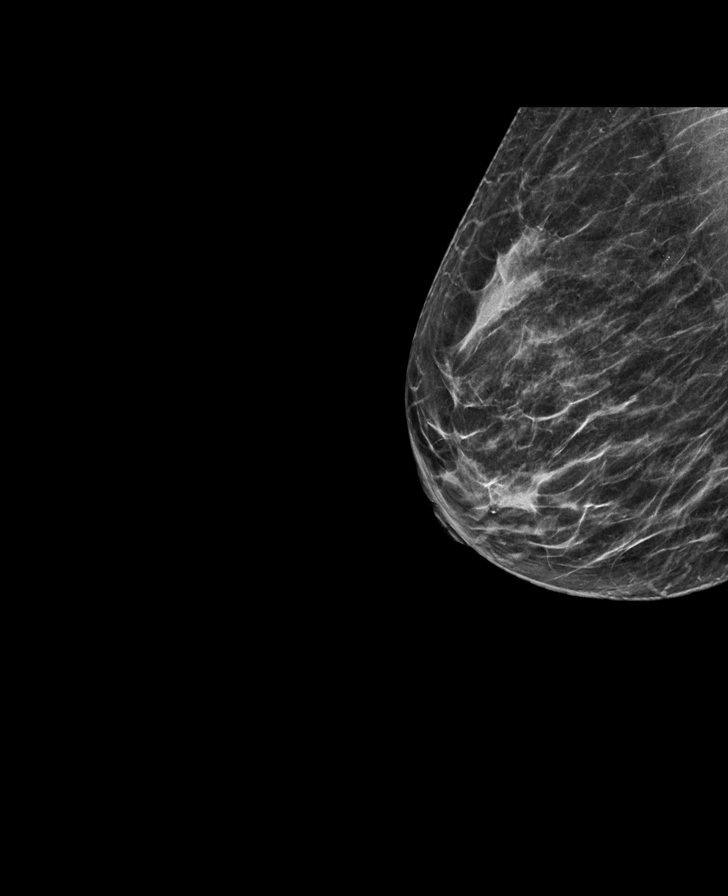

[L MLO synth-2D]
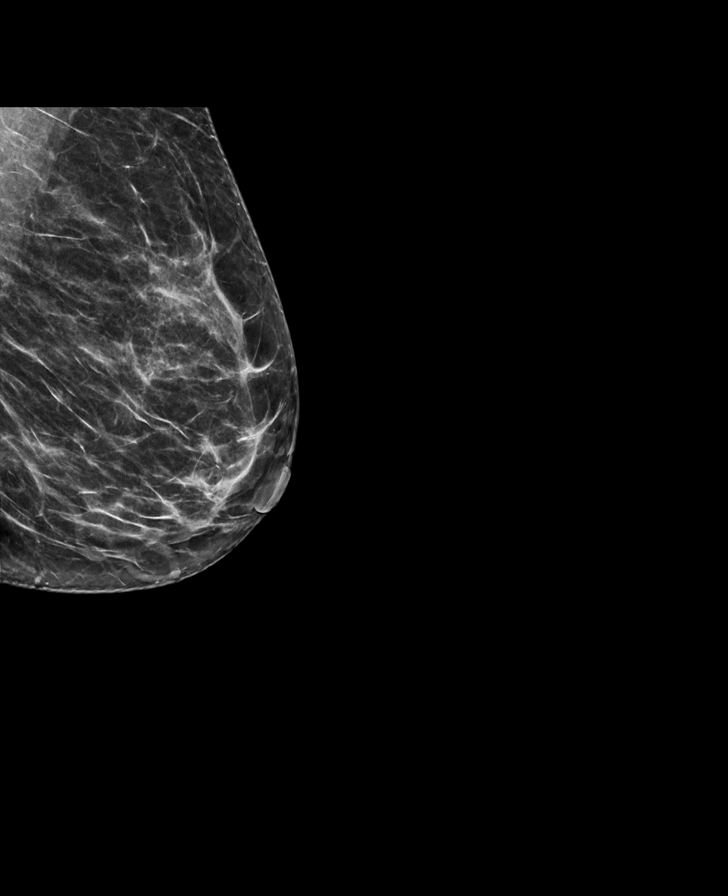

[R MLO tomo · tomo slice 29/56.0]
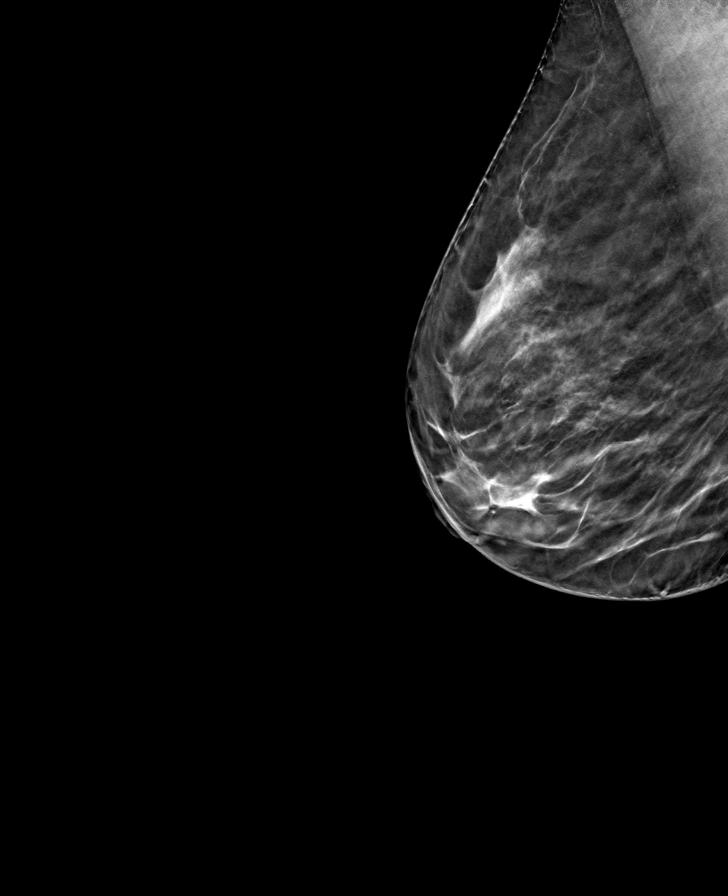

[L CC tomo · tomo slice 27/52.0]
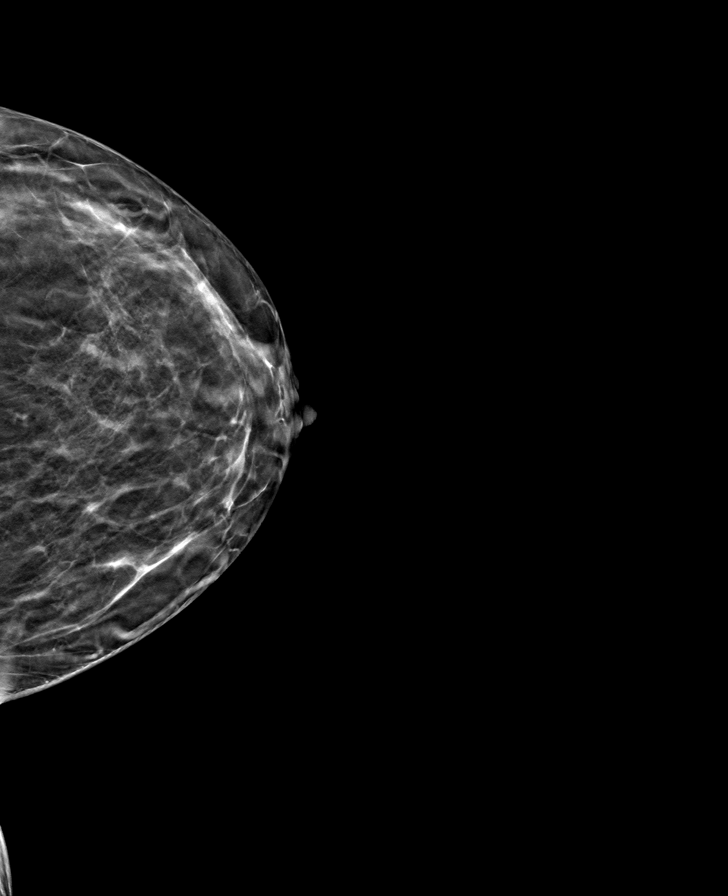

[R CC tomo · tomo slice 26/51.0]
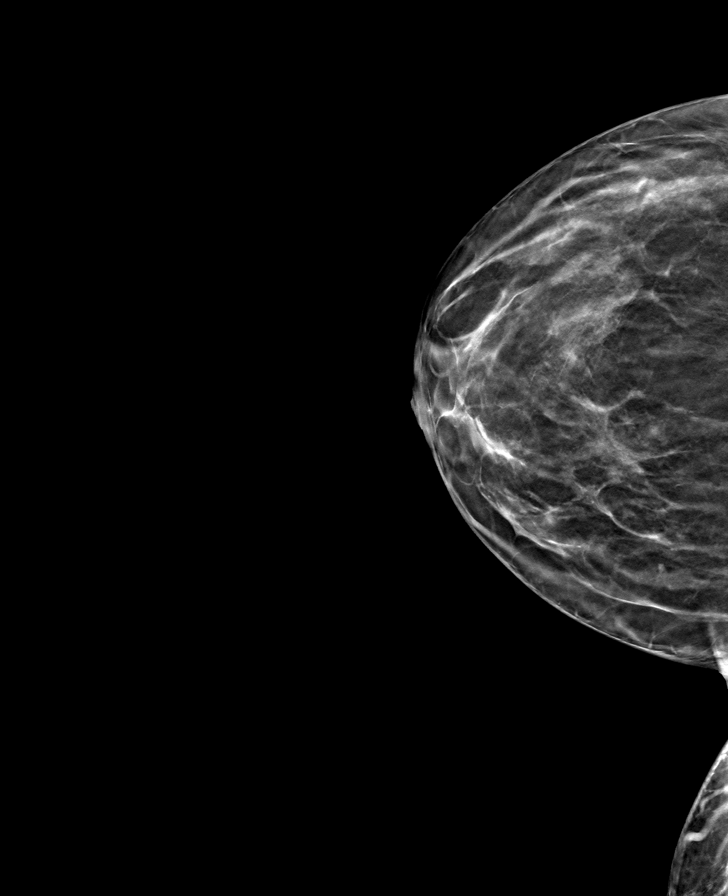

[L MLO tomo · tomo slice 31/60.0]
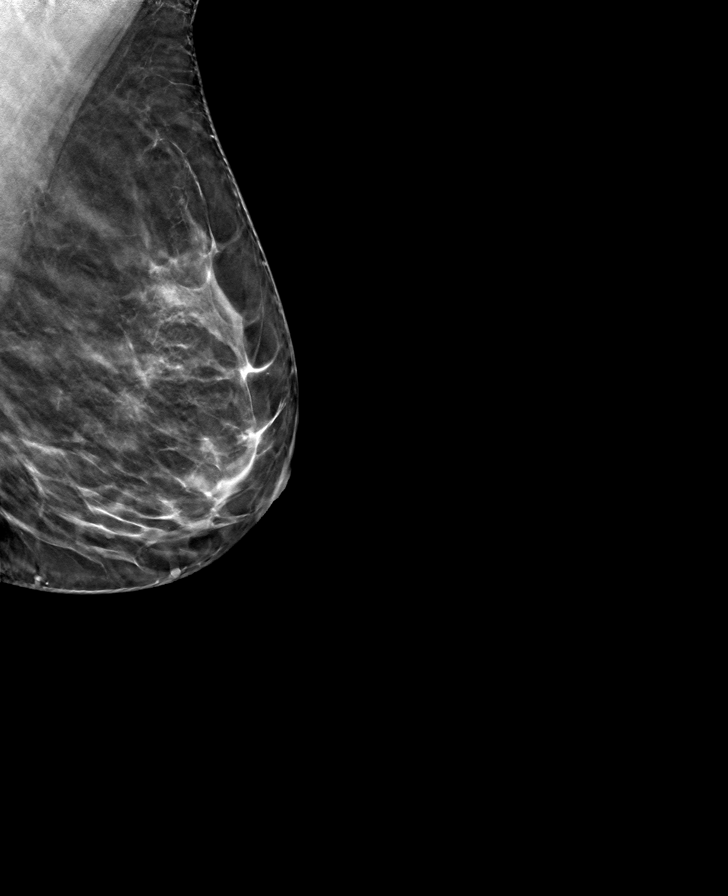

[8 of 24 positions shown; findings below may reference images not displayed]

ACR Breast Density Category b: There are scattered areas of
fibroglandular density.
FINDINGS: There are no findings suspicious for malignancy. Images were
processed with CAD.
IMPRESSION: No mammographic evidence of malignancy. A result letter of this
screening mammogram will be mailed directly to the patient.

RECOMMENDATION:
Screening mammogram in one year. (Code:[id number])

BI-RADS CATEGORY  1: Negative.

## 2022-02-10 IMAGING — CT CT ABD-PELV W/ CM
3 series · 14 of 32 positions shown, 18 images · IV contrast (APPLIED)
Comparison: 04/22/2019 CT chest, abdomen and pelvis.

CLINICAL DATA: Colon cancer status post partial colectomy May 2018. Restaging. Current smoker.

EXAM:
CT CHEST, ABDOMEN, AND PELVIS WITH CONTRAST
TECHNIQUE: Multidetector CT imaging of the chest, abdomen and pelvis was
performed following the standard protocol during bolus
administration of intravenous contrast.
CONTRAST:  100mL PQXO2B-DWW IOPAMIDOL (PQXO2B-DWW) INJECTION 61%

[Series 2: chest/abd/pelvis w/cm · axial · 0.70mm/px · z∈[-563,-48]mm · 8 of 125 slices shown]
[im 11/125  soft-tissue]
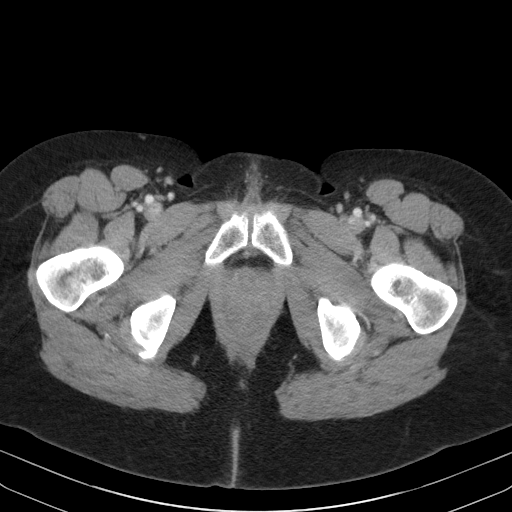
[im 32/125  soft-tissue]
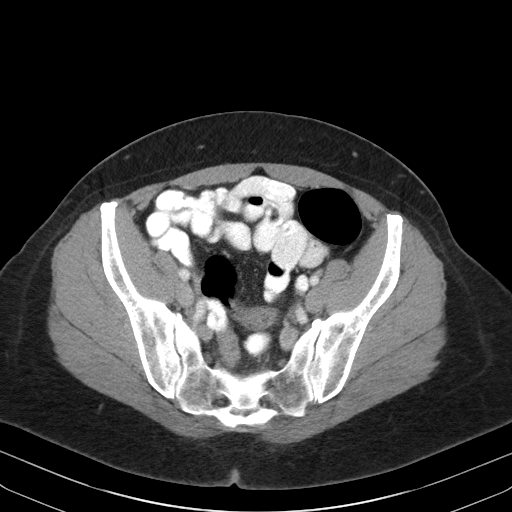
[im 42/125  soft-tissue]
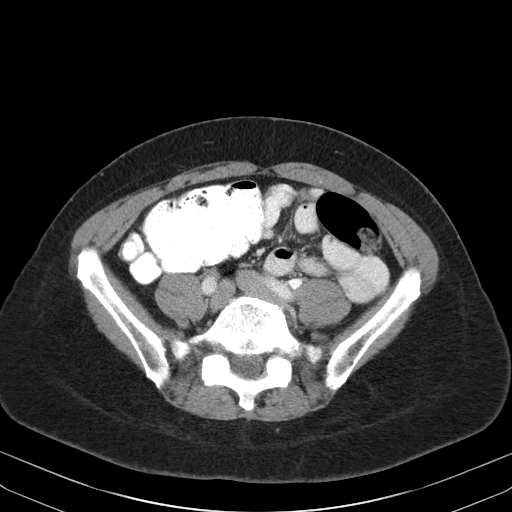
[im 52/125  soft-tissue]
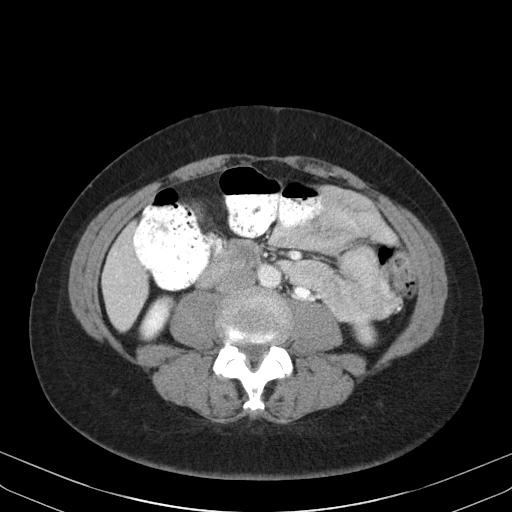
[im 73/125  soft-tissue]
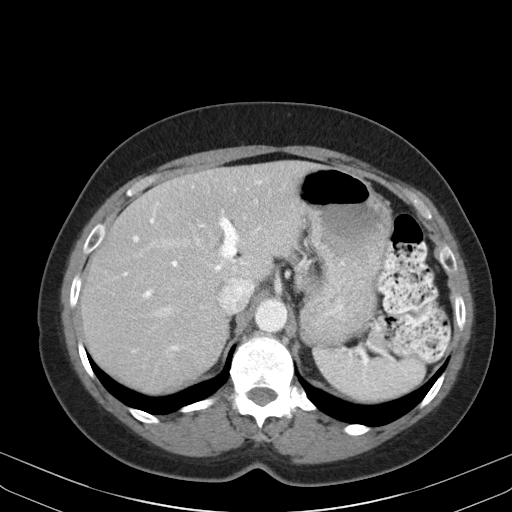
[im 83/125  soft-tissue]
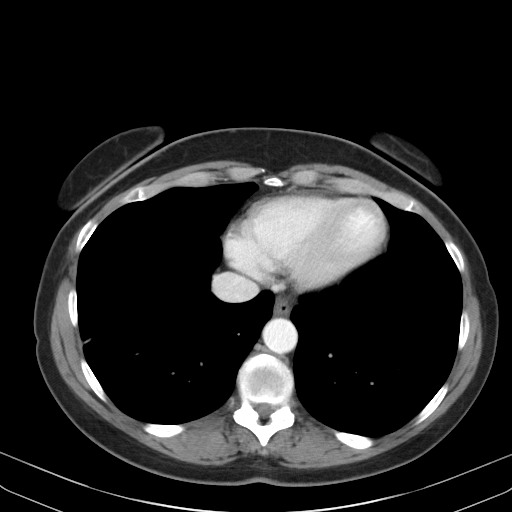
[im 94/125  soft-tissue]
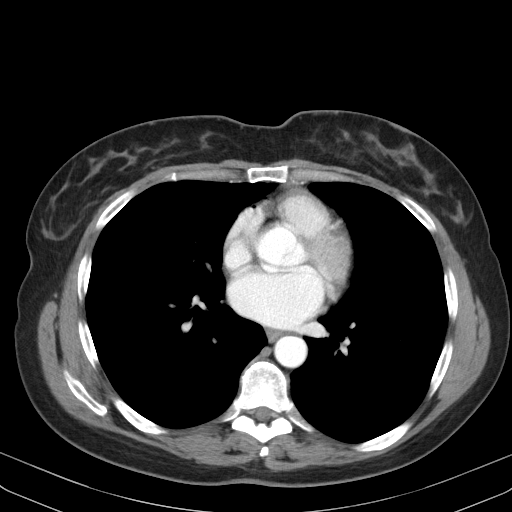
[im 114/125  soft-tissue]
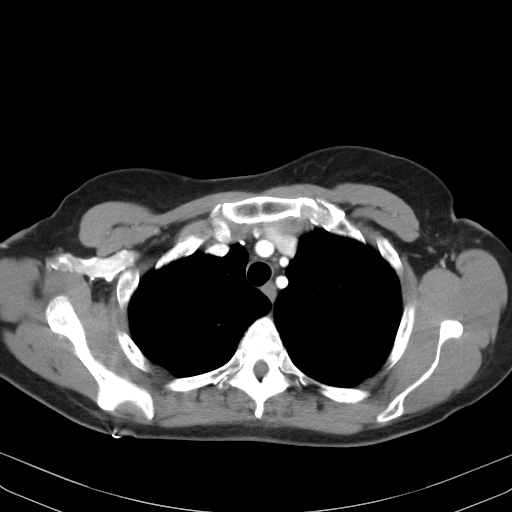

[Series 6: lung · axial · 0.70mm/px · z∈[-269,-189]mm · 3 of 148 slices shown]
[im 10/148  bone]
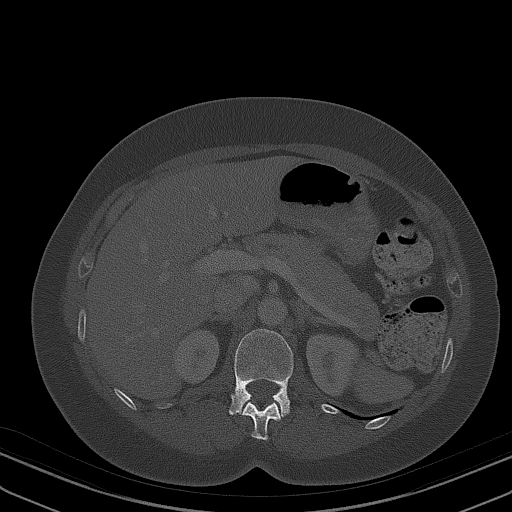
[im 30/148  bone]
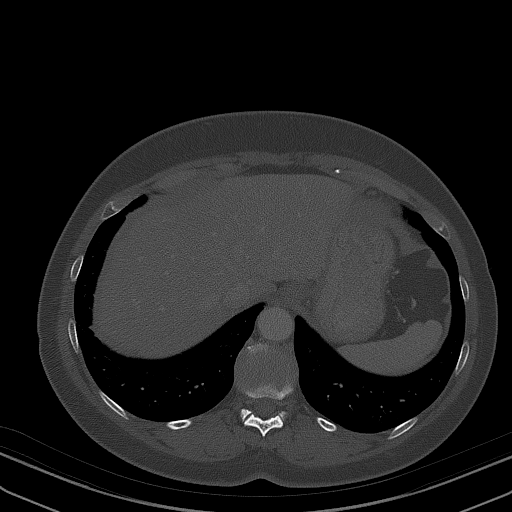
[im 50/148  bone]
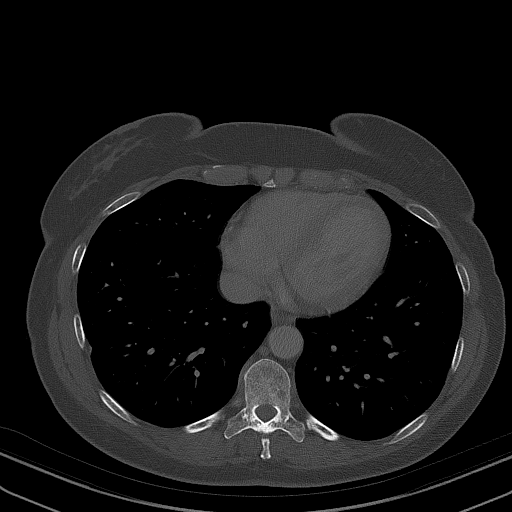

[Series 7: renal delay · axial · delayed · 0.70mm/px · z∈[-379,-244]mm · 3 of 28 slices shown, 7 images]
[im 1/28  soft-tissue]
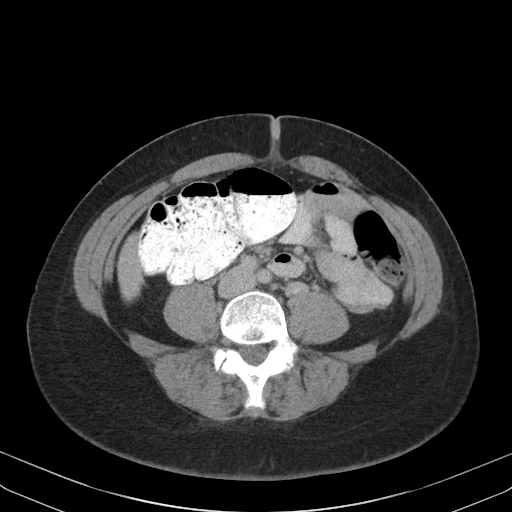
[im 1/28  lung]
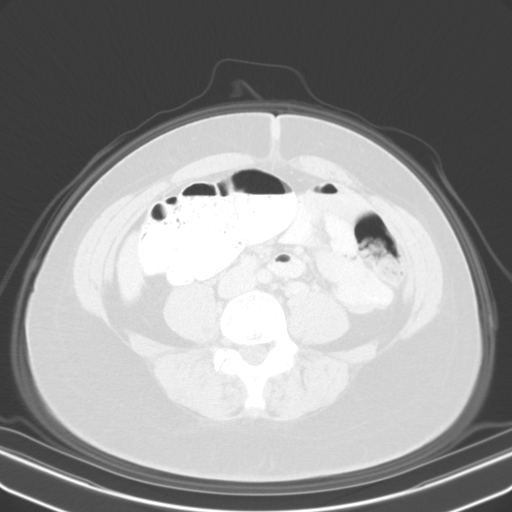
[im 1/28  bone]
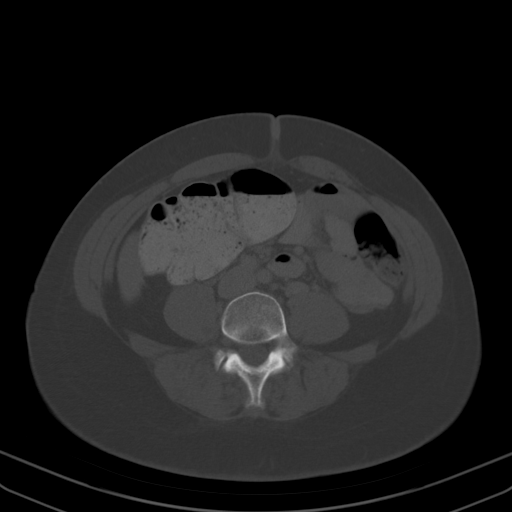
[im 14/28  soft-tissue]
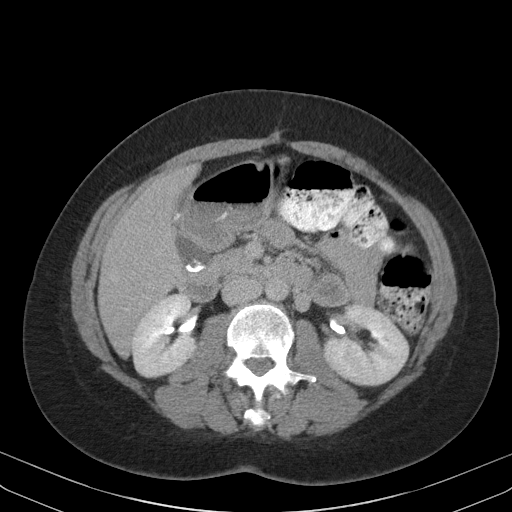
[im 14/28  lung]
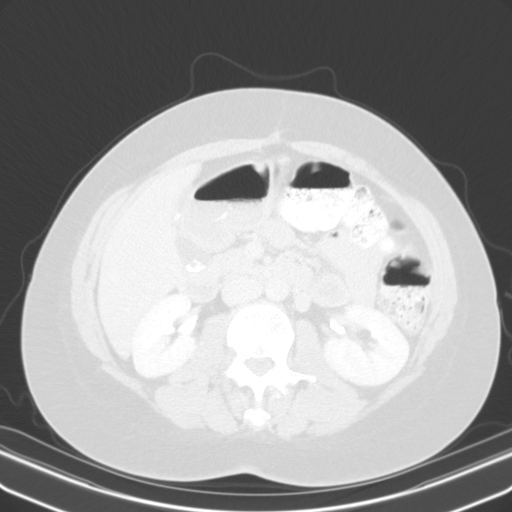
[im 28/28  soft-tissue]
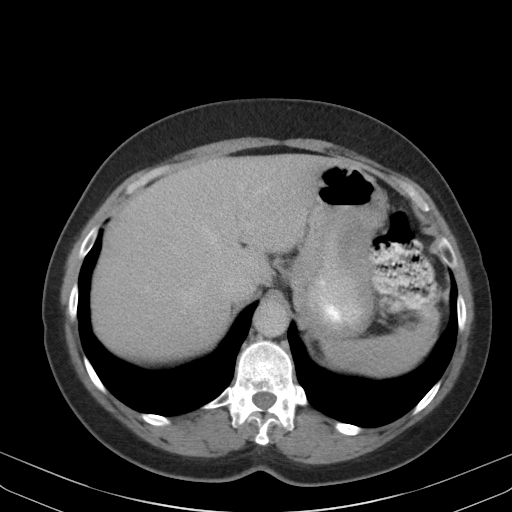
[im 28/28  lung]
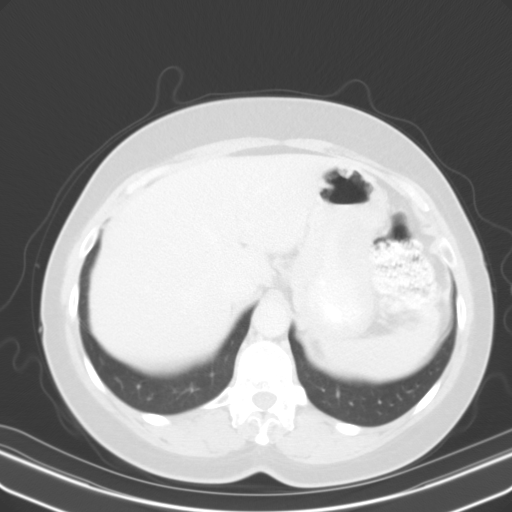

[14 of 32 positions shown; findings below may reference images not displayed]

FINDINGS: CT CHEST FINDINGS

Cardiovascular: Normal heart size. No significant pericardial
effusion/thickening. Normal course and caliber of thoracic aorta.
Top-normal caliber main pulmonary artery (3.1 cm diameter). No
central pulmonary emboli.

Mediastinum/Nodes: No discrete thyroid nodules. Unremarkable
esophagus. No pathologically enlarged axillary, mediastinal or hilar
lymph nodes.

Lungs/Pleura: No pneumothorax. No pleural effusion. Mild
centrilobular emphysema. No acute consolidative airspace disease,
lung masses or significant pulmonary nodules. Stable small
parenchymal band at the peripheral right lung base.

Musculoskeletal: No aggressive appearing focal osseous lesions.
Minimal thoracic spondylosis.

CT ABDOMEN PELVIS FINDINGS

Hepatobiliary: Normal liver size. Two scattered tiny sub 5 mm
hypodense liver lesions are too small to characterize and are
unchanged, considered benign. No new liver masses. Cholelithiasis.
No biliary ductal dilatation.

Pancreas: Normal, with no mass or duct dilation.

Spleen: Normal size. No mass.

Adrenals/Urinary Tract: Normal adrenals. Normal kidneys with no
hydronephrosis and no renal mass. Normal bladder.

Stomach/Bowel: Normal non-distended stomach. Stable postsurgical
changes from subtotal right hemicolectomy with ileocolic anastomosis
in the right abdomen. Normal caliber small bowel loops with no small
bowel wall thickening. Oral contrast transits to the large bowel. No
large bowel wall thickening, colonic diverticulosis or significant
pericolonic fat stranding.

Vascular/Lymphatic: Normal caliber abdominal aorta. Patent portal,
splenic, hepatic and renal veins. No pathologically enlarged lymph
nodes in the abdomen or pelvis.

Reproductive: Stable uterus with nonspecific left lower uterine
cm mass potentially representing a submucosal fibroid (series
2/image 103). No adnexal masses.

Other: No pneumoperitoneum, ascites or focal fluid collection.

Musculoskeletal: No aggressive appearing focal osseous lesions.
Moderate degenerative disc disease at L5-S1.
IMPRESSION: 1. No evidence of metastatic disease in the chest, abdomen or
pelvis.
2. Stable postsurgical changes from subtotal right hemicolectomy.
3. Cholelithiasis.
4. Stable nonspecific 1.5 cm left lower uterine mass, potentially a
submucosal fibroid. Pelvic ultrasound correlation may be obtained as
clinically warranted.
5. Emphysema ([id number]).

## 2022-02-10 IMAGING — CT CT CHEST W/ CM
3 series · 14 of 32 positions shown, 18 images · IV contrast (APPLIED)
Comparison: 04/22/2019 CT chest, abdomen and pelvis.

CLINICAL DATA: Colon cancer status post partial colectomy May 2018. Restaging. Current smoker.

EXAM:
CT CHEST, ABDOMEN, AND PELVIS WITH CONTRAST
TECHNIQUE: Multidetector CT imaging of the chest, abdomen and pelvis was
performed following the standard protocol during bolus
administration of intravenous contrast.
CONTRAST:  100mL PQXO2B-DWW IOPAMIDOL (PQXO2B-DWW) INJECTION 61%

[Series 2: chest/abd/pelvis w/cm · axial · 0.70mm/px · z∈[-563,-48]mm · 8 of 125 slices shown]
[im 11/125  soft-tissue]
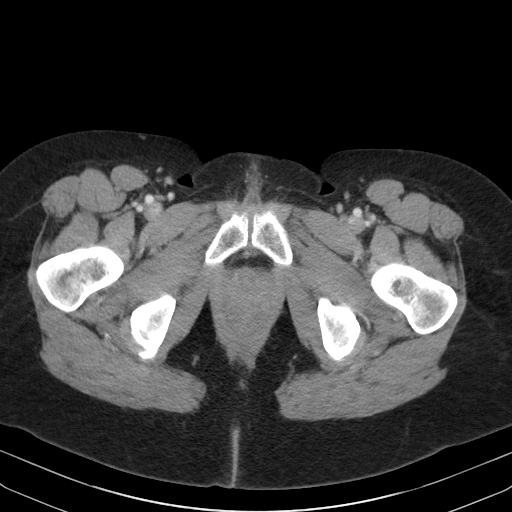
[im 32/125  soft-tissue]
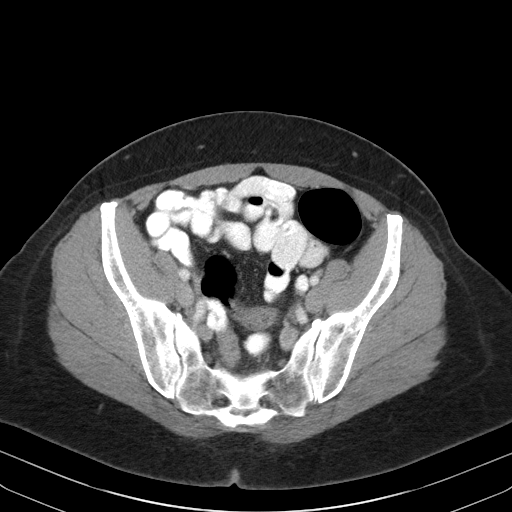
[im 42/125  soft-tissue]
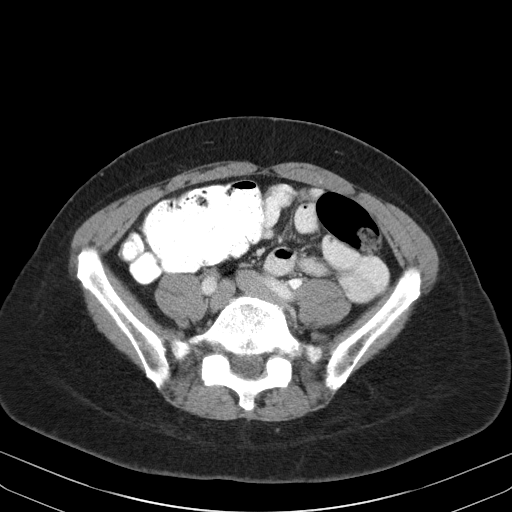
[im 52/125  soft-tissue]
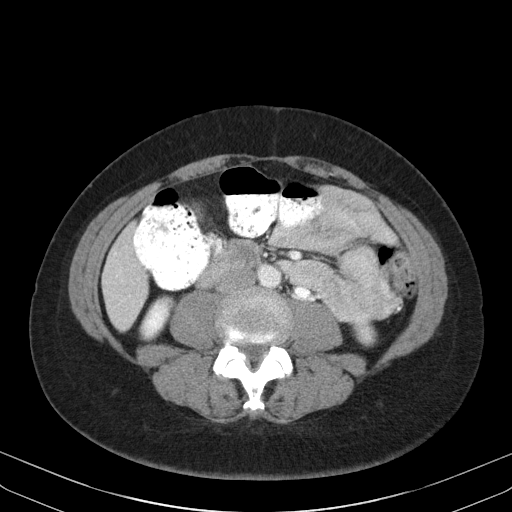
[im 73/125  soft-tissue]
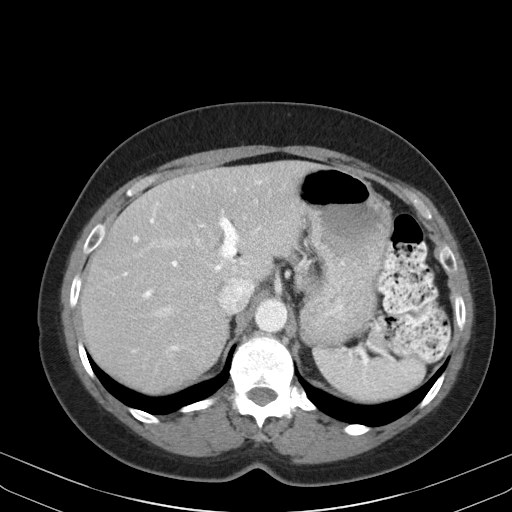
[im 83/125  soft-tissue]
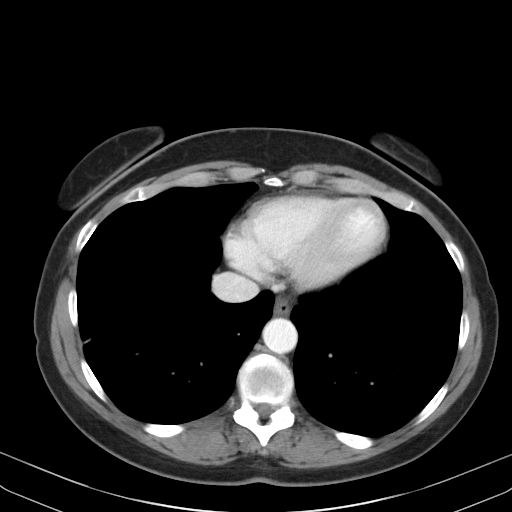
[im 94/125  soft-tissue]
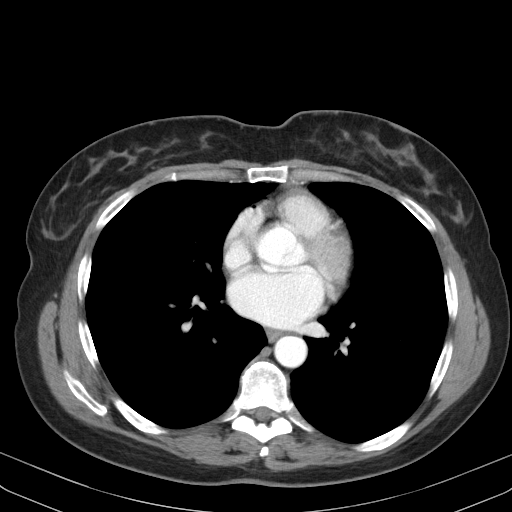
[im 114/125  soft-tissue]
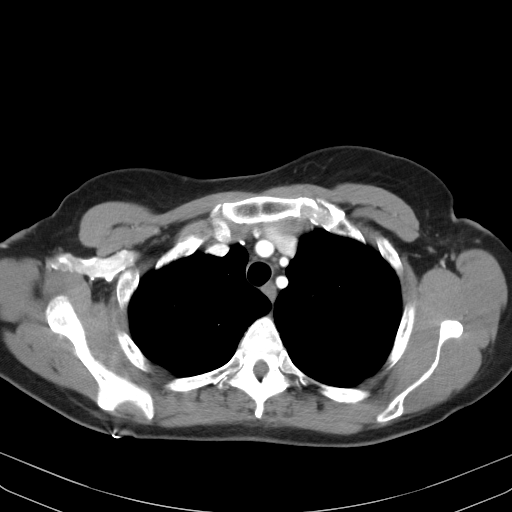

[Series 6: lung · axial · 0.70mm/px · z∈[-269,-189]mm · 3 of 148 slices shown]
[im 10/148  bone]
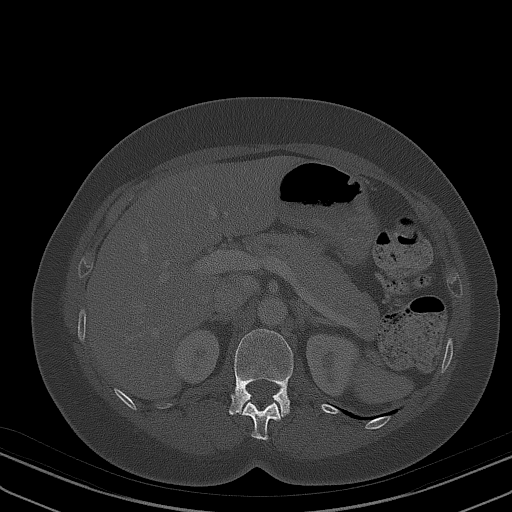
[im 30/148  bone]
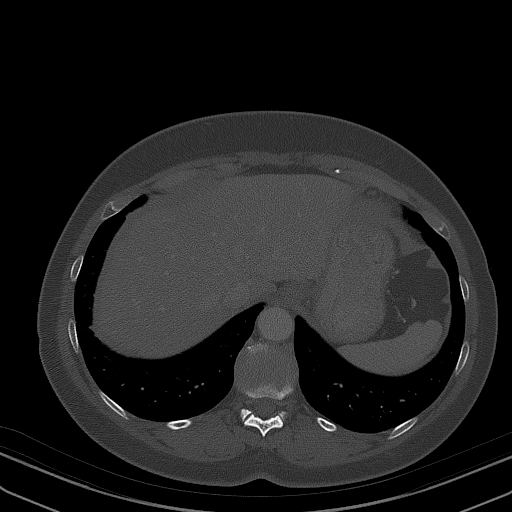
[im 50/148  bone]
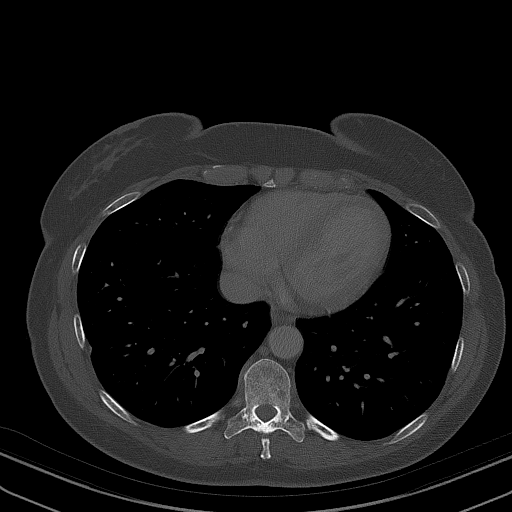

[Series 7: renal delay · axial · delayed · 0.70mm/px · z∈[-379,-244]mm · 3 of 28 slices shown, 7 images]
[im 1/28  soft-tissue]
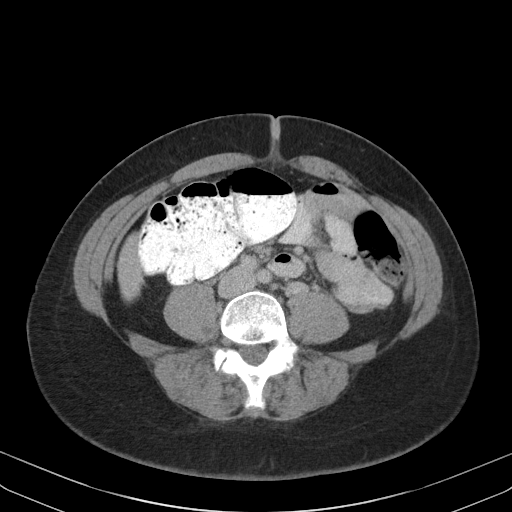
[im 1/28  lung]
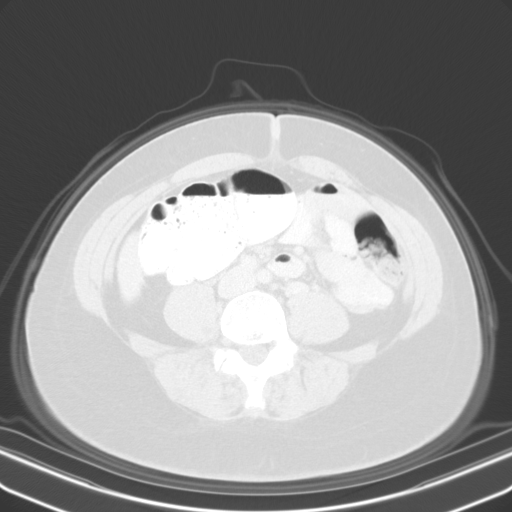
[im 1/28  bone]
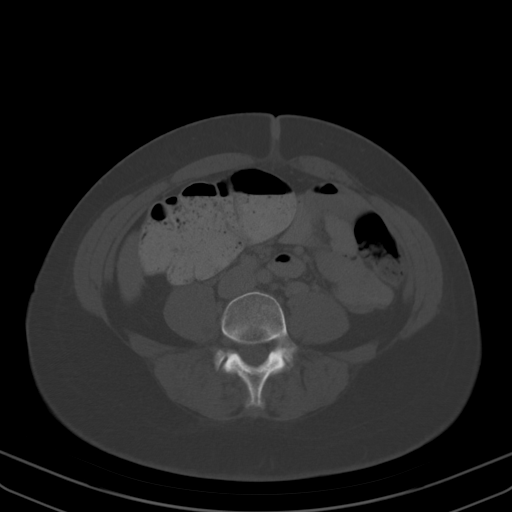
[im 14/28  soft-tissue]
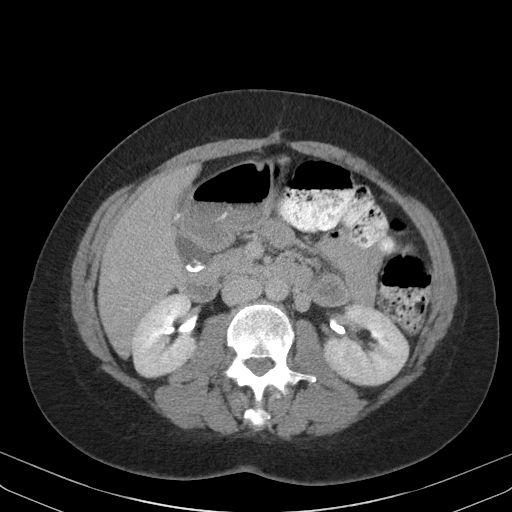
[im 14/28  lung]
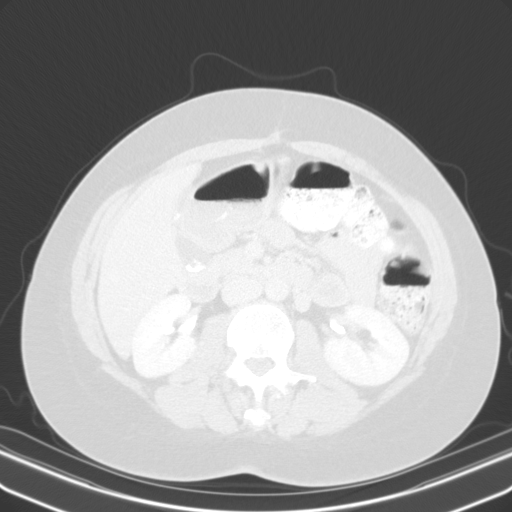
[im 28/28  soft-tissue]
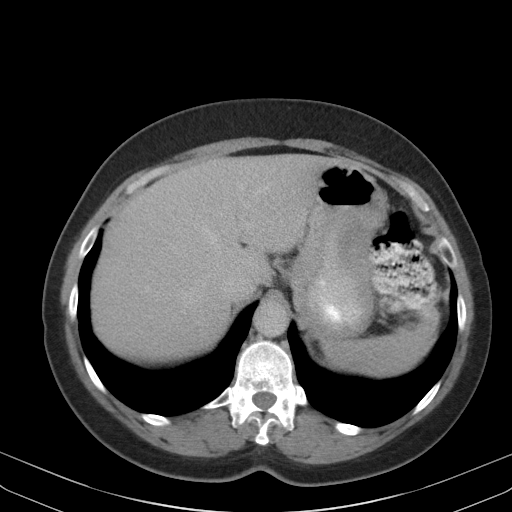
[im 28/28  lung]
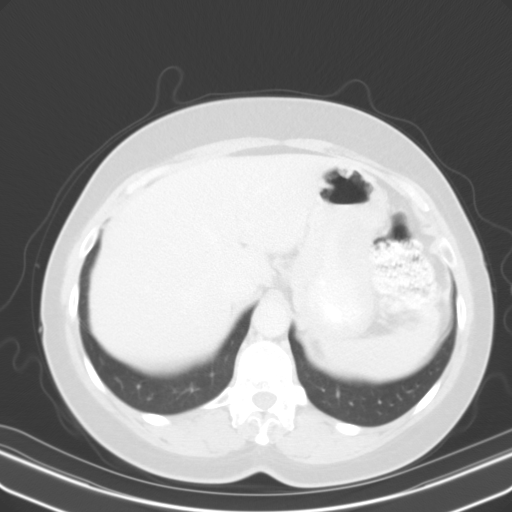

[14 of 32 positions shown; findings below may reference images not displayed]

FINDINGS: CT CHEST FINDINGS

Cardiovascular: Normal heart size. No significant pericardial
effusion/thickening. Normal course and caliber of thoracic aorta.
Top-normal caliber main pulmonary artery (3.1 cm diameter). No
central pulmonary emboli.

Mediastinum/Nodes: No discrete thyroid nodules. Unremarkable
esophagus. No pathologically enlarged axillary, mediastinal or hilar
lymph nodes.

Lungs/Pleura: No pneumothorax. No pleural effusion. Mild
centrilobular emphysema. No acute consolidative airspace disease,
lung masses or significant pulmonary nodules. Stable small
parenchymal band at the peripheral right lung base.

Musculoskeletal: No aggressive appearing focal osseous lesions.
Minimal thoracic spondylosis.

CT ABDOMEN PELVIS FINDINGS

Hepatobiliary: Normal liver size. Two scattered tiny sub 5 mm
hypodense liver lesions are too small to characterize and are
unchanged, considered benign. No new liver masses. Cholelithiasis.
No biliary ductal dilatation.

Pancreas: Normal, with no mass or duct dilation.

Spleen: Normal size. No mass.

Adrenals/Urinary Tract: Normal adrenals. Normal kidneys with no
hydronephrosis and no renal mass. Normal bladder.

Stomach/Bowel: Normal non-distended stomach. Stable postsurgical
changes from subtotal right hemicolectomy with ileocolic anastomosis
in the right abdomen. Normal caliber small bowel loops with no small
bowel wall thickening. Oral contrast transits to the large bowel. No
large bowel wall thickening, colonic diverticulosis or significant
pericolonic fat stranding.

Vascular/Lymphatic: Normal caliber abdominal aorta. Patent portal,
splenic, hepatic and renal veins. No pathologically enlarged lymph
nodes in the abdomen or pelvis.

Reproductive: Stable uterus with nonspecific left lower uterine
cm mass potentially representing a submucosal fibroid (series
2/image 103). No adnexal masses.

Other: No pneumoperitoneum, ascites or focal fluid collection.

Musculoskeletal: No aggressive appearing focal osseous lesions.
Moderate degenerative disc disease at L5-S1.
IMPRESSION: 1. No evidence of metastatic disease in the chest, abdomen or
pelvis.
2. Stable postsurgical changes from subtotal right hemicolectomy.
3. Cholelithiasis.
4. Stable nonspecific 1.5 cm left lower uterine mass, potentially a
submucosal fibroid. Pelvic ultrasound correlation may be obtained as
clinically warranted.
5. Emphysema ([id number]).

## 2022-05-01 ENCOUNTER — Encounter: Payer: Self-pay | Admitting: Gastroenterology

## 2024-02-24 ENCOUNTER — Emergency Department (HOSPITAL_COMMUNITY)
Admission: EM | Admit: 2024-02-24 | Discharge: 2024-02-25 | Disposition: A | Discharge status: Home / Self Care | Attending: Emergency Medicine | Admitting: Emergency Medicine

## 2024-02-24 ENCOUNTER — Other Ambulatory Visit: Payer: Self-pay

## 2024-02-24 DIAGNOSIS — B352 Tinea manuum: Secondary | ICD-10-CM | POA: Diagnosis not present

## 2024-02-24 DIAGNOSIS — Z85038 Personal history of other malignant neoplasm of large intestine: Secondary | ICD-10-CM | POA: Diagnosis not present

## 2024-02-24 DIAGNOSIS — B353 Tinea pedis: Secondary | ICD-10-CM | POA: Diagnosis not present

## 2024-02-24 DIAGNOSIS — R21 Rash and other nonspecific skin eruption: Secondary | ICD-10-CM | POA: Diagnosis present

## 2024-02-24 LAB — CBC WITH DIFFERENTIAL/PLATELET
Abs Immature Granulocytes: 0.01 K/uL (ref 0.00–0.07)
Basophils Absolute: 0.1 K/uL (ref 0.0–0.1)
Basophils Relative: 1 %
Eosinophils Absolute: 0.1 K/uL (ref 0.0–0.5)
Eosinophils Relative: 2 %
HCT: 39.1 % (ref 36.0–46.0)
Hemoglobin: 12.8 g/dL (ref 12.0–15.0)
Immature Granulocytes: 0 %
Lymphocytes Relative: 38 %
Lymphs Abs: 2.6 K/uL (ref 0.7–4.0)
MCH: 28.8 pg (ref 26.0–34.0)
MCHC: 32.7 g/dL (ref 30.0–36.0)
MCV: 87.9 fL (ref 80.0–100.0)
Monocytes Absolute: 0.3 K/uL (ref 0.1–1.0)
Monocytes Relative: 5 %
Neutro Abs: 3.6 K/uL (ref 1.7–7.7)
Neutrophils Relative %: 54 %
Platelets: 330 K/uL (ref 150–400)
RBC: 4.45 MIL/uL (ref 3.87–5.11)
RDW: 13.9 % (ref 11.5–15.5)
WBC: 6.7 K/uL (ref 4.0–10.5)
nRBC: 0 % (ref 0.0–0.2)

## 2024-02-24 LAB — COMPREHENSIVE METABOLIC PANEL WITH GFR
ALT: 21 U/L (ref 0–44)
AST: 23 U/L (ref 15–41)
Albumin: 3.7 g/dL (ref 3.5–5.0)
Alkaline Phosphatase: 70 U/L (ref 38–126)
Anion gap: 9 (ref 5–15)
BUN: 11 mg/dL (ref 6–20)
CO2: 22 mmol/L (ref 22–32)
Calcium: 9.4 mg/dL (ref 8.9–10.3)
Chloride: 107 mmol/L (ref 98–111)
Creatinine, Ser: 0.68 mg/dL (ref 0.44–1.00)
GFR, Estimated: >60 mL/min (ref >60)
Glucose, Bld: 97 mg/dL (ref 70–99)
Potassium: 3.8 mmol/L (ref 3.5–5.1)
Sodium: 138 mmol/L (ref 135–145)
Total Bilirubin: 0.2 mg/dL (ref 0.0–1.2)
Total Protein: 7.3 g/dL (ref 6.5–8.1)

## 2024-02-24 NOTE — ED Triage Notes (Signed)
 Pt is here for itching rash on her hands and feet, skin is peeling in her palms.

## 2024-02-24 NOTE — ED Triage Notes (Signed)
 Pt reports that the rash on her hands started 2 weeks ago and today she has a rash on her feet causing her pain.

## 2024-02-24 NOTE — ED Provider Triage Note (Signed)
 Emergency Medicine Provider Triage Evaluation Note  Tara Baxter , a 58 y.o. female  was evaluated in triage.  Pt complains of rash and vesicles on hands and feet for approximately 2 weeks.  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  Review of Systems  Positive: Rash Negative: Fever  Physical Exam  BP 109/60   Pulse 85   Temp 98.9 F (37.2 C)   Resp 16   Ht 5' 9 (1.753 m)   Wt 77.1 kg   SpO2 99%   BMI 25.10 kg/m  Gen:   Awake, no distress   Resp:  Normal effort  MSK:   Moves extremities without difficulty  Other:    Medical Decision Making  Medically screening exam initiated at 5:17 PM.  Appropriate orders placed.  Tara Baxter was informed that the remainder of the evaluation will be completed by another provider, this initial triage assessment does not replace that evaluation, and the importance of remaining in the ED until their evaluation is complete.     Tara Warren SAILOR, PA-C 02/24/24 1718

## 2024-02-25 MED ORDER — CLOTRIMAZOLE 1 % EX CREA: TOPICAL_CREAM | CUTANEOUS | 0 refills | Status: AC

## 2024-02-25 NOTE — Discharge Instructions (Addendum)
 Please stop using the steroid cream.  As Discussed, you will need to follow-up with primary care and dermatology.  Seek emergency care if experiencing any new or worsening symptoms.

## 2024-02-25 NOTE — ED Provider Notes (Signed)
 [place] EMERGENCY DEPARTMENT AT [facility] Provider Note   CSN: [id number] Arrival date & time: 02/24/24  1512     Patient presents with: Rash   Tara Baxter is a 58 y.o. female with PMHx colon cancer s/p colectomy, anemia who presents to ED concerned for rash on BL hands progressing over the past 2 weeks. 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 Patient initially noticed a small area/circle of rash on the palm of left hand and in between fingers. Patient then started applying steroid cream and rash is getting worse and has spread to BL palms. Patient stating that rash started appearing on bottoms of feet yesterday as well.  Patient denies recent fever, chest pain, shortness of breath, rhinorrhea, congestion, cough, nausea, vomiting.    Rash      Prior to Admission medications   Medication Sig Start Date End Date Taking? Authorizing Provider  clotrimazole  (LOTRIMIN ) 1 % cream Apply to affected and surrounding area(s) twice daily until 1 week after clinical resolution, typically for 4 weeks total. 02/25/24  Yes Hoy Nidia FALCON, PA-C    Allergies: Patient has no known allergies.    Review of Systems  Skin:  Positive for rash.    Updated Vital Signs BP (!) 169/88 (BP Location: Left Arm)   Pulse 74   Temp 98.7 F (37.1 C)   Resp 15   Ht 5' 9 (1.753 m)   Wt 77.1 kg   SpO2 98%   BMI 25.10 kg/m   Physical Exam Vitals and nursing note reviewed.  Constitutional:      General: She is not in acute distress.    Appearance: She is not ill-appearing or toxic-appearing.  HENT:     Head: Normocephalic and atraumatic.  Eyes:     General: No scleral icterus.       Right eye: No discharge.        Left eye: No discharge.     Conjunctiva/sclera: Conjunctivae normal.  Cardiovascular:     Rate and Rhythm: Normal rate.  Pulmonary:     Effort: Pulmonary effort is normal.  Abdominal:     General: Abdomen is flat.  Skin:    General: Skin is  warm and dry.     Comments: Flaking/dried skin on BL palms. No underlying erythema or swelling. Negative nikolsky sign. No rash on interwebbing of fingers. No oral lesions.  +2 Radial pulses.  Sensation light touch intact.  Neurological:     General: No focal deficit present.     Mental Status: She is alert. Mental status is at baseline.  Psychiatric:        Mood and Affect: Mood normal.        Behavior: Behavior normal.     (all labs ordered are listed, but only abnormal results are displayed) Labs Reviewed  CBC WITH DIFFERENTIAL/PLATELET  COMPREHENSIVE METABOLIC PANEL WITH GFR    EKG: None  Radiology: No results found.   Procedures   Medications Ordered in the ED - No data to display                                  Medical Decision Making  This patient presents to the ED for concern of rash, this involves an extensive number of treatment options, and is a complaint that carries with it a high risk of complications and morbidity.  The differential diagnosis includes irritant  contact dermatitis, DRESS, atopic dermatitis, anaphylaxis, SJS/TEN   Co morbidities that complicate the patient evaluation  none   Additional history obtained:  No PCP listed in chart   Problem List / ED Course / Critical interventions / Medication management  Patient presents to ED concern for rash on BL hands progressing over the past 2 weeks.  Patient stating that rash on bottoms of feet started yesterday.  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  Patient also stating that she has been putting steroid cream on her hands daily for the past 2 weeks.  Physical exam with dried/flaking skin on BL palms.  Rest of physical exam reassuring.  Patient afebrile with stable vitals. I Ordered, and personally interpreted labs.  CBC without leukocytosis or anemia.  CMP reassuring. I most concern for tinea infection of hands and feet.  Rash could also be due to  irritant contact dermatitis from long-term use of steroid cream on her hands.  Educated patient to stop steroid cream use.  Will provide patient with fungal cream and have her follow-up with dermatology.  Also to follow-up with PCP.  Patient verbalized understanding of plan. I have reviewed the patients home medicines and have made adjustments as needed The patient has been appropriately medically screened and/or stabilized in the ED. I have low suspicion for any other emergent medical condition which would require further screening, evaluation or treatment in the ED or require inpatient management. At time of discharge the patient is hemodynamically stable and in no acute distress. I have discussed work-up results and diagnosis with patient and answered all questions. Patient is agreeable with discharge plan. We discussed strict return precautions for returning to the emergency department and they verbalized understanding.    Ddx: these are considered less likely due to history of present illness and physical exam -DRESS: patient afebrile, stable vitals -atopic dermatitis: rash diffuse, not confined to flexural areas -anaphylaxis: O2 100%, no nasal flaring or troubles breathing -SJS/TEN: negative Nikolsky sign -[place] Spotted Fever/Lyme Disease: no hx tick bite or fever   Social Determinants of Health:  none       Final diagnoses:  Tinea pedis of both feet  Tinea manuum    ED Discharge Orders          Ordered    clotrimazole  (LOTRIMIN ) 1 % cream        02/25/24 0027               Hoy Nidia FALCON, PA-C 02/25/24 0030    Griselda Norris, MD 02/25/24 [id number]
